# Patient Record
Sex: Female | Born: 1980 | Race: Black or African American | Hispanic: No | Marital: Single | Smoking: Never smoker
Health system: Southern US, Community
[De-identification: ages and names within clinical notes are randomized; demographics above are authoritative.]

## PROBLEM LIST (undated history)

## (undated) ENCOUNTER — Inpatient Hospital Stay (HOSPITAL_COMMUNITY): Payer: Self-pay

## (undated) HISTORY — PX: ECTOPIC PREGNANCY SURGERY: SHX613

---

## 2008-02-05 ENCOUNTER — Emergency Department (HOSPITAL_COMMUNITY): Admission: EM | Admit: 2008-02-05 | Discharge: 2008-02-05 | Payer: Self-pay | Admitting: Emergency Medicine

## 2008-04-03 ENCOUNTER — Encounter: Payer: Self-pay | Admitting: Emergency Medicine

## 2008-04-03 ENCOUNTER — Inpatient Hospital Stay (HOSPITAL_COMMUNITY): Admission: AD | Admit: 2008-04-03 | Discharge: 2008-04-03 | Payer: Self-pay | Admitting: Obstetrics and Gynecology

## 2008-04-06 ENCOUNTER — Inpatient Hospital Stay (HOSPITAL_COMMUNITY): Admission: AD | Admit: 2008-04-06 | Discharge: 2008-04-06 | Payer: Self-pay | Admitting: Obstetrics and Gynecology

## 2008-04-09 ENCOUNTER — Inpatient Hospital Stay (HOSPITAL_COMMUNITY): Admission: AD | Admit: 2008-04-09 | Discharge: 2008-04-09 | Payer: Self-pay | Admitting: Obstetrics and Gynecology

## 2008-04-12 ENCOUNTER — Inpatient Hospital Stay (HOSPITAL_COMMUNITY): Admission: AD | Admit: 2008-04-12 | Discharge: 2008-04-12 | Payer: Self-pay | Admitting: Obstetrics and Gynecology

## 2008-04-19 ENCOUNTER — Inpatient Hospital Stay (HOSPITAL_COMMUNITY): Admission: AD | Admit: 2008-04-19 | Discharge: 2008-04-19 | Payer: Self-pay | Admitting: Obstetrics and Gynecology

## 2008-04-28 ENCOUNTER — Inpatient Hospital Stay (HOSPITAL_COMMUNITY): Admission: AD | Admit: 2008-04-28 | Discharge: 2008-04-28 | Payer: Self-pay | Admitting: Obstetrics and Gynecology

## 2008-05-28 ENCOUNTER — Emergency Department (HOSPITAL_COMMUNITY): Admission: EM | Admit: 2008-05-28 | Discharge: 2008-05-28 | Payer: Self-pay | Admitting: Family Medicine

## 2008-06-28 ENCOUNTER — Emergency Department (HOSPITAL_COMMUNITY): Admission: EM | Admit: 2008-06-28 | Discharge: 2008-06-28 | Payer: Self-pay | Admitting: Emergency Medicine

## 2009-12-06 IMAGING — US US OB COMP LESS 14 WK
1 series · 14 of 28 positions shown · non-contrast
Comparison: None

CLINICAL DATA: *Abdominal pain,ABD PAIN; ; QUANTITATIVE BETA HCG
101.

OBSTETRIC <14 WK US AND TRANSVAGINAL OB US
TECHNIQUE: Both transabdominal and transvaginal ultrasound
examinations were performed for complete evaluation of the
gestation as well as the maternal uterus, adnexal regions, and
pelvic cul-de-sac.

[Series 1: unknown · 0.32mm/px · 14 of 49 slices shown]
[im 2/49]
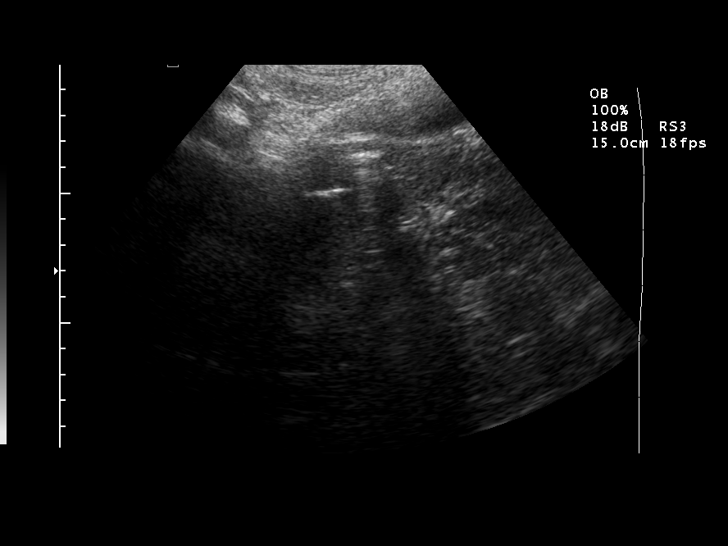
[im 6/49]
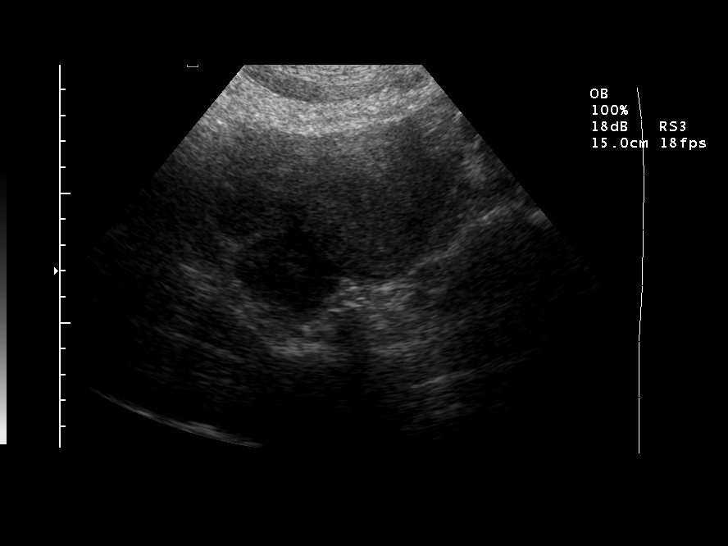
[im 9/49]
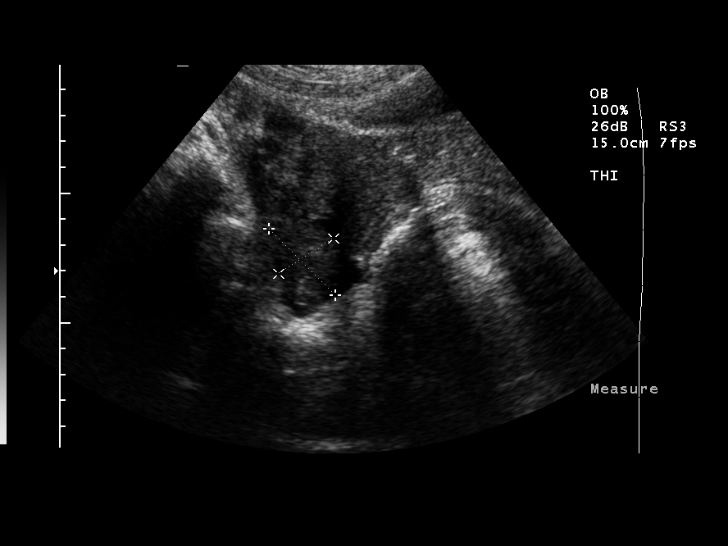
[im 13/49]
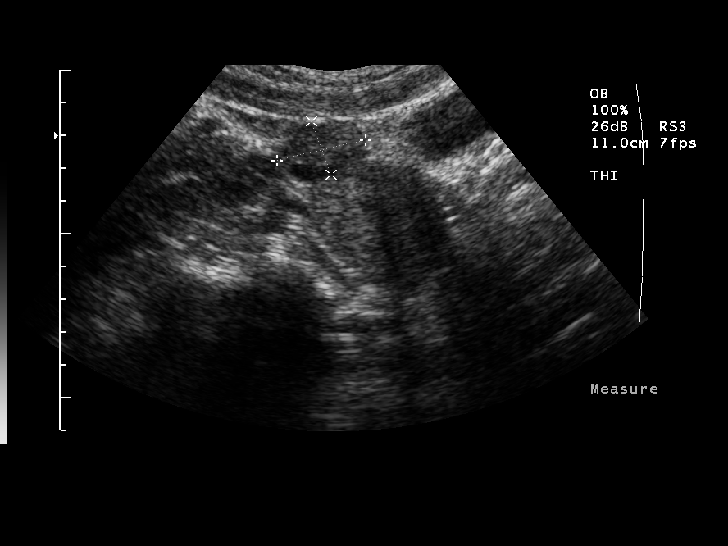
[im 17/49]
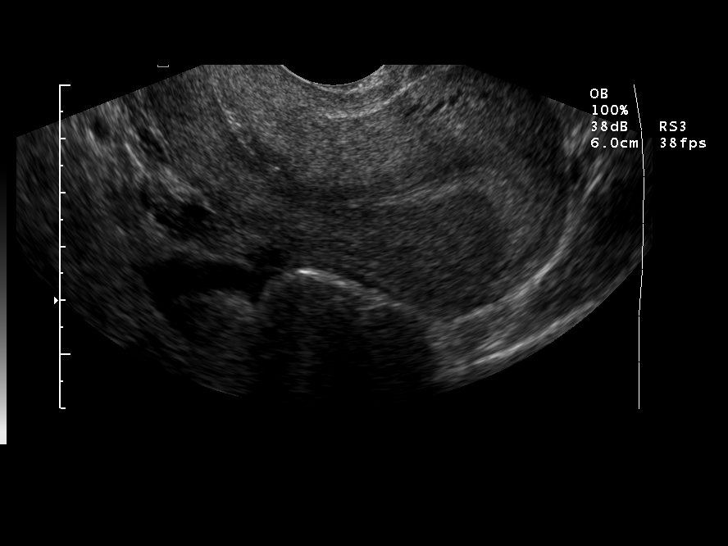
[im 20/49]
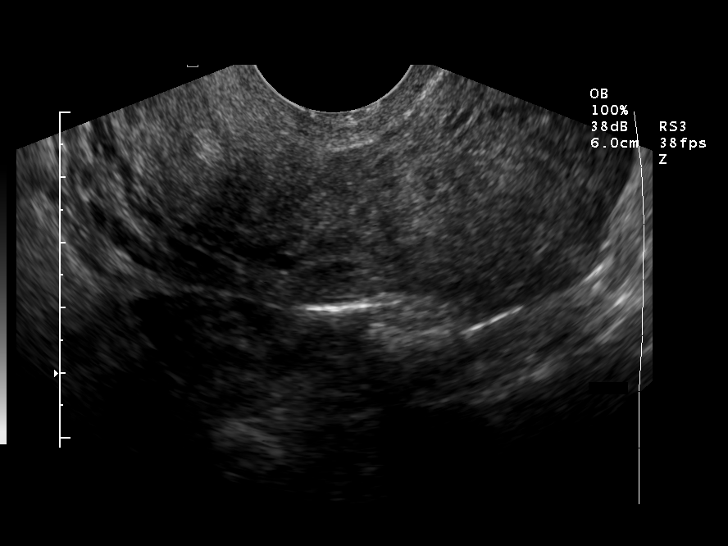
[im 24/49]
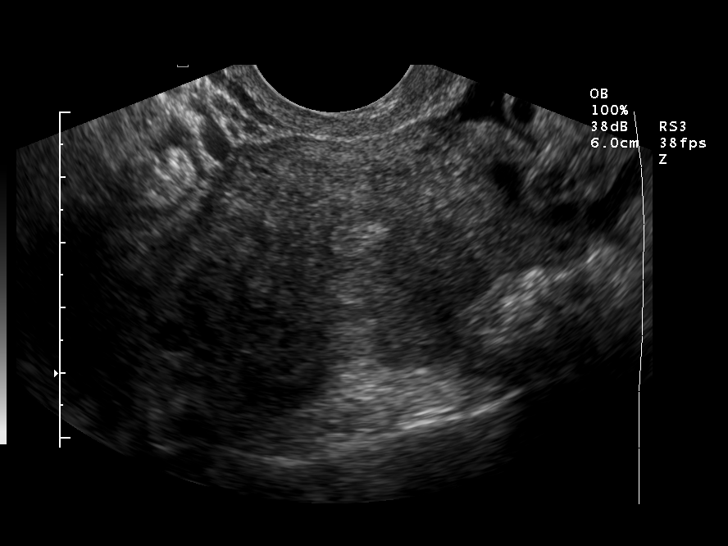
[im 27/49]
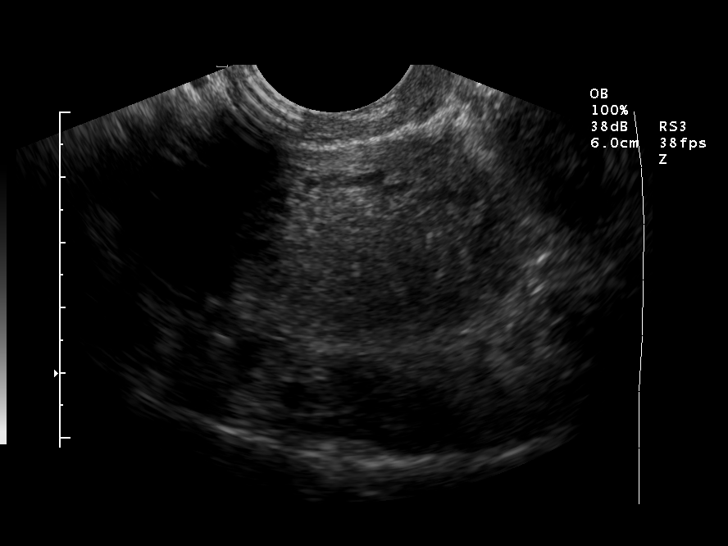
[im 31/49]
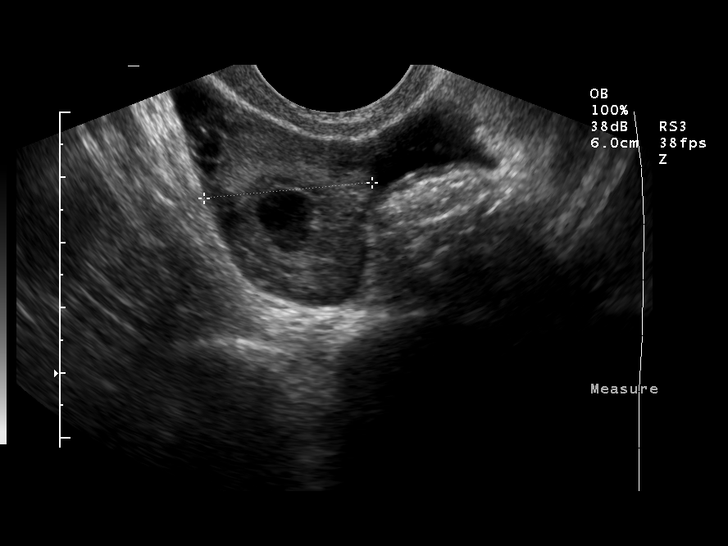
[im 34/49]
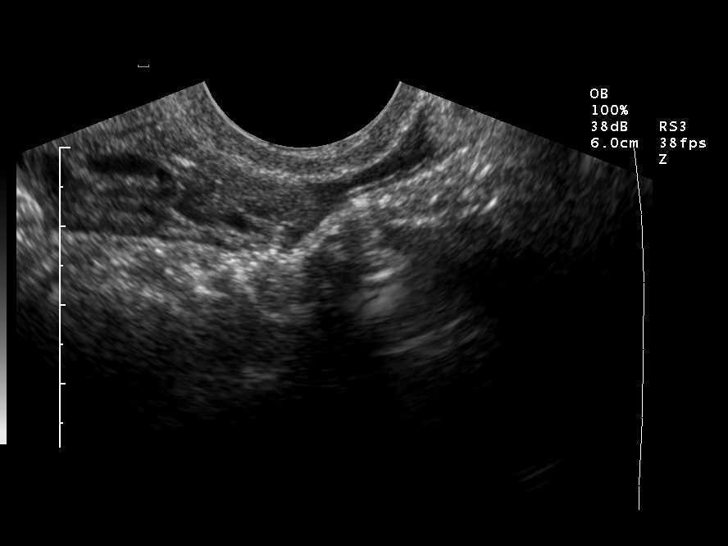
[im 38/49]
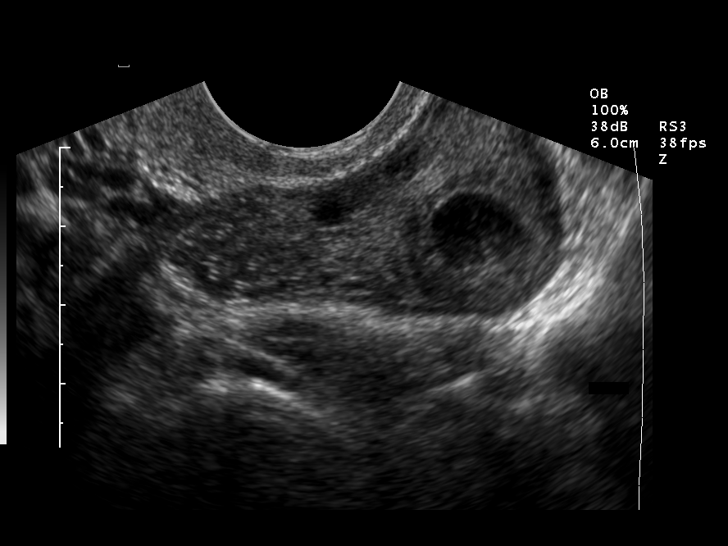
[im 41/49]
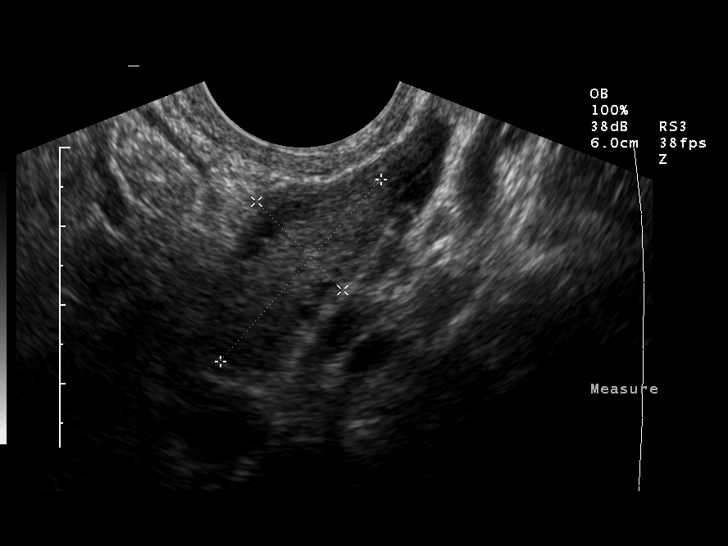
[im 45/49]
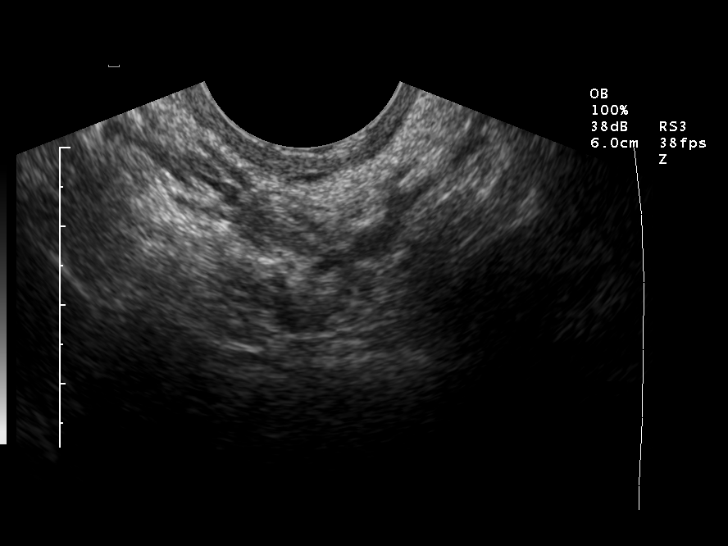
[im 49/49]
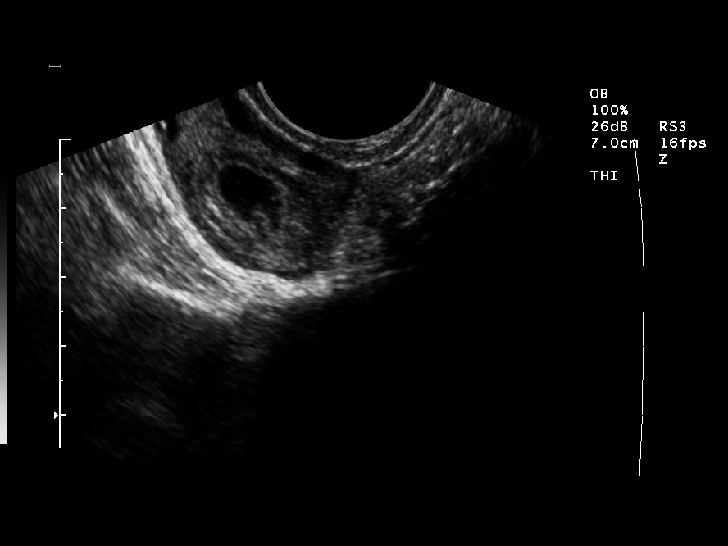

[14 of 28 positions shown; findings below may reference images not displayed]

FINDINGS: There is no intrauterine gestational sac identified.
Uterus is unremarkable.  Endometrium 8 mm in thickness.

Within the right adnexa, there is a 10 x 9 x 9 mm cystic structure
present.  Debris noted within the cystic structure.  There is
moderate free fluid in the pelvis.  Left ovary unremarkable.
IMPRESSION: No intrauterine gestational sac.

1 cm cystic structure in the right adnexa with surrounding blood
flow and moderate free fluid.  This could represent a right ectopic
pregnancy.

Critical test results telephoned to Dr. Gadidos at the time of
interpretation on 04/03/2008 at 3883.

## 2010-01-12 ENCOUNTER — Emergency Department (HOSPITAL_COMMUNITY): Admission: EM | Admit: 2010-01-12 | Discharge: 2010-01-12 | Payer: Self-pay | Admitting: Emergency Medicine

## 2010-08-14 LAB — GC/CHLAMYDIA PROBE AMP, GENITAL: Chlamydia, DNA Probe: NEGATIVE

## 2010-08-14 LAB — POCT URINALYSIS DIP (DEVICE)
Hgb urine dipstick: NEGATIVE
Nitrite: NEGATIVE
Urobilinogen, UA: 0.2 mg/dL (ref 0.0–1.0)
pH: 6 (ref 5.0–8.0)

## 2010-08-14 LAB — WET PREP, GENITAL
Trich, Wet Prep: NONE SEEN
Yeast Wet Prep HPF POC: NONE SEEN

## 2010-08-14 LAB — POCT PREGNANCY, URINE: Preg Test, Ur: NEGATIVE

## 2011-02-02 LAB — URINALYSIS, ROUTINE W REFLEX MICROSCOPIC
Bilirubin Urine: NEGATIVE
Glucose, UA: NEGATIVE mg/dL
Ketones, ur: 15 mg/dL — AB
Protein, ur: 30 mg/dL — AB

## 2011-02-02 LAB — POCT I-STAT, CHEM 8
BUN: 6 mg/dL (ref 6–23)
Calcium, Ion: 1.24 mmol/L (ref 1.12–1.32)
Chloride: 100 mEq/L (ref 96–112)

## 2011-02-02 LAB — DIFFERENTIAL
Basophils Absolute: 0 10*3/uL (ref 0.0–0.1)
Basophils Relative: 0 % (ref 0–1)
Monocytes Absolute: 0.5 10*3/uL (ref 0.1–1.0)
Neutro Abs: 6.3 10*3/uL (ref 1.7–7.7)
Neutrophils Relative %: 63 % (ref 43–77)

## 2011-02-02 LAB — HCG, QUANTITATIVE, PREGNANCY
hCG, Beta Chain, Quant, S: 10 m[IU]/mL — ABNORMAL HIGH (ref <5)
hCG, Beta Chain, Quant, S: 214 m[IU]/mL — ABNORMAL HIGH (ref <5)
hCG, Beta Chain, Quant, S: 627 m[IU]/mL — ABNORMAL HIGH (ref <5)
hCG, Beta Chain, Quant, S: 634 m[IU]/mL — ABNORMAL HIGH (ref <5)
hCG, Beta Chain, Quant, S: <2 m[IU]/mL (ref <5)

## 2011-02-02 LAB — URINE CULTURE: Colony Count: >=100000

## 2011-02-02 LAB — URINE MICROSCOPIC-ADD ON

## 2011-02-02 LAB — CBC
Hemoglobin: 11.2 g/dL — ABNORMAL LOW (ref 12.0–15.0)
Hemoglobin: 12.1 g/dL (ref 12.0–15.0)
MCHC: 33.7 g/dL (ref 30.0–36.0)
Platelets: 183 10*3/uL (ref 150–400)
RDW: 12.9 % (ref 11.5–15.5)
RDW: 13.3 % (ref 11.5–15.5)

## 2011-02-02 LAB — ABO/RH
ABO/RH(D): O POS
ABO/RH(D): O POS

## 2011-02-02 LAB — WET PREP, GENITAL: Trich, Wet Prep: NONE SEEN

## 2011-02-02 LAB — GC/CHLAMYDIA PROBE AMP, GENITAL: Chlamydia, DNA Probe: NEGATIVE

## 2011-02-02 LAB — AST: AST: 13 U/L (ref 0–37)

## 2011-10-19 ENCOUNTER — Encounter (HOSPITAL_COMMUNITY): Payer: Self-pay | Admitting: *Deleted

## 2011-10-19 ENCOUNTER — Emergency Department (HOSPITAL_COMMUNITY)
Admission: EM | Admit: 2011-10-19 | Discharge: 2011-10-19 | Disposition: A | Discharge status: Home / Self Care | Payer: Managed Care, Other (non HMO) | Attending: Emergency Medicine | Admitting: Emergency Medicine

## 2011-10-19 DIAGNOSIS — R42 Dizziness and giddiness: Secondary | ICD-10-CM | POA: Insufficient documentation

## 2011-10-19 DIAGNOSIS — R110 Nausea: Secondary | ICD-10-CM | POA: Insufficient documentation

## 2011-10-19 DIAGNOSIS — R51 Headache: Secondary | ICD-10-CM

## 2011-10-19 LAB — POCT I-STAT, CHEM 8
Calcium, Ion: 1.29 mmol/L (ref 1.12–1.32)
Glucose, Bld: 79 mg/dL (ref 70–99)
HCT: 38 % (ref 36.0–46.0)
Hemoglobin: 12.9 g/dL (ref 12.0–15.0)

## 2011-10-19 MED ORDER — SODIUM CHLORIDE 0.9 % IV BOLUS (SEPSIS)
1000.0000 mL | Freq: Once | INTRAVENOUS | Status: AC
  Administered 2011-10-19: 1000 mL via INTRAVENOUS

## 2011-10-19 MED ORDER — METOCLOPRAMIDE HCL 5 MG/ML IJ SOLN
10.0000 mg | Freq: Once | INTRAMUSCULAR | Status: AC
  Administered 2011-10-19: 10 mg via INTRAVENOUS
  Filled 2011-10-19: qty 2

## 2011-10-19 MED ORDER — NAPROXEN 375 MG PO TABS: 375.0000 mg | ORAL_TABLET | Freq: Two times a day (BID) | ORAL | Status: AC

## 2011-10-19 MED ORDER — KETOROLAC TROMETHAMINE 30 MG/ML IJ SOLN
30.0000 mg | Freq: Once | INTRAMUSCULAR | Status: AC
  Administered 2011-10-19: 30 mg via INTRAVENOUS
  Filled 2011-10-19: qty 1

## 2011-10-19 MED ORDER — MORPHINE SULFATE 4 MG/ML IJ SOLN
4.0000 mg | Freq: Once | INTRAMUSCULAR | Status: AC
  Administered 2011-10-19: 4 mg via INTRAVENOUS
  Filled 2011-10-19: qty 1

## 2011-10-19 NOTE — ED Notes (Signed)
Pt states she's had a headache and slight dizziness since last pm

## 2011-10-19 NOTE — ED Provider Notes (Signed)
History     CSN: [id number]  Arrival date & time 10/19/11  1427   First MD Initiated Contact with Patient 10/19/11 1559      Chief Complaint  Patient presents with  . Nausea  . Headache  . Dizziness     The history is provided by the patient.   the patient reports developing a headache last night.  She has photophobia and phonophobia.  She has a history of headaches when she was younger but has not had a headache this severe and very long time.  She reports generalized weakness and nausea.  She has had some dizziness.  Denies vomiting.  She is or diarrhea.  She denies chest pain shortness of breath.  She denies weakness of her upper and lower extremities . She has no changes in her vision.  Her symptoms are mild to moderate in severity.  Nothing improves her symptoms.     History reviewed. No pertinent past medical history.  Past Surgical History  Procedure Date  . Ectopic pregnancy surgery     History reviewed. No pertinent family history.  History  Substance Use Topics  . Smoking status: Not on file  . Smokeless tobacco: Not on file  . Alcohol Use: No    OB History    Grav Para Term Preterm Abortions TAB SAB Ect Mult Living                  Review of Systems  Neurological: Positive for headaches.  All other systems reviewed and are negative.    Allergies  Review of patient's allergies indicates no known allergies.  Home Medications   Current Outpatient Rx  Name Route Sig Dispense Refill  . CALCIUM CARBONATE 600 MG PO TABS Oral Take 600 mg by mouth daily.    . WOMENS DAILY FORMULA PO Oral Take 1 tablet by mouth daily.      BP 107/64  Pulse 60  Temp 98.4 F (36.9 C) (Oral)  Resp 16  Ht 5\' 2"  (1.575 m)  Wt 130 lb (58.968 kg)  BMI 23.78 kg/m2  SpO2 100%  Physical Exam  Nursing note and vitals reviewed. Constitutional: She is oriented to person, place, and time. She appears well-developed and well-nourished. No distress.  HENT:  Head:  Normocephalic and atraumatic.  Eyes: EOM are normal. Pupils are equal, round, and reactive to light.  Neck: Normal range of motion.  Cardiovascular: Normal rate, regular rhythm and normal heart sounds.   Pulmonary/Chest: Effort normal and breath sounds normal.  Abdominal: Soft. She exhibits no distension. There is no tenderness.  Musculoskeletal: Normal range of motion.  Neurological: She is alert and oriented to person, place, and time.       5/5 strength in major muscle groups of  bilateral upper and lower extremities. Speech normal. No facial asymetry.   Skin: Skin is warm and dry.  Psychiatric: She has a normal mood and affect. Judgment normal.    ED Course  Procedures (including critical care time)   Labs Reviewed  PREGNANCY, URINE  POCT I-STAT, CHEM 8   No results found.   No diagnosis found.    MDM  Typical headache for the pt. Non focal neuro exam. No recent head trauma. No fever. Doubt meningitis. Doubt intracranial bleed. Doubt normal pressure hydrocephalus. No indication for imaging. Will treat with migraine cocktail and reevaluate  5:06 PM The pt feels better. HA improved. Dc home in good condition       Vania Rea Mylo,  MD 10/19/11 1706

## 2011-10-19 NOTE — Discharge Instructions (Signed)

## 2011-10-19 NOTE — ED Notes (Signed)
Pt c/o HA, dizziness, and nausea since last night. States she has headaches but dizziness is new. Denies vomiting.

## 2012-08-25 ENCOUNTER — Emergency Department (INDEPENDENT_AMBULATORY_CARE_PROVIDER_SITE_OTHER)
Admission: EM | Admit: 2012-08-25 | Discharge: 2012-08-25 | Disposition: A | Discharge status: Home / Self Care | Payer: Self-pay | Source: Home / Self Care | Attending: Family Medicine | Admitting: Family Medicine

## 2012-08-25 ENCOUNTER — Encounter (HOSPITAL_COMMUNITY): Payer: Self-pay | Admitting: Emergency Medicine

## 2012-08-25 DIAGNOSIS — J302 Other seasonal allergic rhinitis: Secondary | ICD-10-CM

## 2012-08-25 DIAGNOSIS — J309 Allergic rhinitis, unspecified: Secondary | ICD-10-CM

## 2012-08-25 MED ORDER — TRIAMCINOLONE ACETONIDE 40 MG/ML IJ SUSP
40.0000 mg | Freq: Once | INTRAMUSCULAR | Status: AC
  Administered 2012-08-25: 40 mg via INTRAMUSCULAR

## 2012-08-25 MED ORDER — METHYLPREDNISOLONE ACETATE 80 MG/ML IJ SUSP
INTRAMUSCULAR | Status: AC
  Filled 2012-08-25: qty 1

## 2012-08-25 MED ORDER — METHYLPREDNISOLONE ACETATE 40 MG/ML IJ SUSP
80.0000 mg | Freq: Once | INTRAMUSCULAR | Status: AC
  Administered 2012-08-25: 80 mg via INTRAMUSCULAR

## 2012-08-25 MED ORDER — BUDESONIDE 32 MCG/ACT NA SUSP: 1.0000 | Freq: Two times a day (BID) | NASAL | Status: DC

## 2012-08-25 MED ORDER — TRIAMCINOLONE ACETONIDE 40 MG/ML IJ SUSP
INTRAMUSCULAR | Status: AC
  Filled 2012-08-25: qty 5

## 2012-08-25 MED ORDER — FEXOFENADINE HCL 180 MG PO TABS: 180.0000 mg | ORAL_TABLET | Freq: Every day | ORAL | Status: DC

## 2012-08-25 NOTE — ED Notes (Signed)
Sore throat , both ears hurting, generally right ear more painful than left ear, headache- onset last Thursday.  Has tried mucinex and aleve products with no relief.

## 2012-08-25 NOTE — ED Notes (Signed)
Notified of post injection delay, patient not ready for discharge

## 2012-08-25 NOTE — ED Notes (Signed)
Patient not ready for discharge, medications to be administered.

## 2012-08-25 NOTE — ED Provider Notes (Signed)
History     CSN: [id number]  Arrival date & time 08/25/12  1225   First MD Initiated Contact with Patient 08/25/12 1434      Chief Complaint  Patient presents with  . Sore Throat    (Consider location/radiation/quality/duration/timing/severity/associated sxs/prior treatment) Patient is a 32 y.o. female presenting with pharyngitis. The history is provided by the patient.  Sore Throat This is a new problem. The current episode started more than 2 days ago. The problem has been gradually worsening. The symptoms are aggravated by sneezing.    History reviewed. No pertinent past medical history.  Past Surgical History  Procedure Laterality Date  . Ectopic pregnancy surgery      No family history on file.  History  Substance Use Topics  . Smoking status: Not on file  . Smokeless tobacco: Not on file  . Alcohol Use: No    OB History   Grav Para Term Preterm Abortions TAB SAB Ect Mult Living                  Review of Systems  Constitutional: Negative.   HENT: Positive for congestion, sore throat, rhinorrhea, sneezing and postnasal drip.     Allergies  Review of patient's allergies indicates no known allergies.  Home Medications   Current Outpatient Rx  Name  Route  Sig  Dispense  Refill  . dextromethorphan-guaiFENesin (MUCINEX DM) 30-600 MG per 12 hr tablet   Oral   Take 1 tablet by mouth every 12 (twelve) hours.         . Naproxen Sodium (ALEVE PO)   Oral   Take by mouth.         . budesonide (RHINOCORT AQUA) 32 MCG/ACT nasal spray   Nasal   Place 1 spray into the nose 2 (two) times daily. One spray each nostril bid   1 Bottle   1   . calcium carbonate (OS-CAL) 600 MG TABS   Oral   Take 600 mg by mouth daily.         . fexofenadine (ALLEGRA) 180 MG tablet   Oral   Take 1 tablet (180 mg total) by mouth daily.   30 tablet   1   . Multiple Vitamins-Minerals (WOMENS DAILY FORMULA PO)   Oral   Take 1 tablet by mouth daily.         .  naproxen (NAPROSYN) 375 MG tablet   Oral   Take 1 tablet (375 mg total) by mouth 2 (two) times daily.   6 tablet   0     BP 115/69  Pulse 64  Temp(Src) 98.4 F (36.9 C) (Oral)  Resp 12  SpO2 100%  Physical Exam  Nursing note and vitals reviewed. Constitutional: She is oriented to person, place, and time. She appears well-developed and well-nourished.  HENT:  Head: Normocephalic.  Right Ear: External ear normal.  Left Ear: External ear normal.  Nose: Mucosal edema and rhinorrhea present.  Mouth/Throat: Oropharynx is clear and moist.  Neck: Normal range of motion. Neck supple.  Cardiovascular: Regular rhythm.   Pulmonary/Chest: Effort normal and breath sounds normal.  Lymphadenopathy:    She has no cervical adenopathy.  Neurological: She is alert and oriented to person, place, and time.  Skin: Skin is warm.    ED Course  Procedures (including critical care time)  Labs Reviewed - No data to display No results found.   1. Seasonal allergic reaction       MDM  Linna Hoff, MD 08/26/12 2013

## 2014-04-09 ENCOUNTER — Encounter (HOSPITAL_COMMUNITY): Payer: Self-pay | Admitting: Emergency Medicine

## 2014-04-09 ENCOUNTER — Other Ambulatory Visit (HOSPITAL_COMMUNITY)
Admission: RE | Admit: 2014-04-09 | Discharge: 2014-04-09 | Disposition: A | Discharge status: Home / Self Care | Payer: BC Managed Care – PPO | Source: Ambulatory Visit | Attending: Emergency Medicine | Admitting: Emergency Medicine

## 2014-04-09 ENCOUNTER — Emergency Department (HOSPITAL_COMMUNITY)
Admission: EM | Admit: 2014-04-09 | Discharge: 2014-04-09 | Disposition: A | Discharge status: Home / Self Care | Payer: BC Managed Care – PPO | Source: Home / Self Care

## 2014-04-09 DIAGNOSIS — N760 Acute vaginitis: Secondary | ICD-10-CM | POA: Diagnosis present

## 2014-04-09 DIAGNOSIS — A64 Unspecified sexually transmitted disease: Secondary | ICD-10-CM

## 2014-04-09 DIAGNOSIS — R102 Pelvic and perineal pain: Secondary | ICD-10-CM

## 2014-04-09 LAB — POCT URINALYSIS DIP (DEVICE)
BILIRUBIN URINE: NEGATIVE
Glucose, UA: NEGATIVE mg/dL
Hgb urine dipstick: NEGATIVE
KETONES UR: NEGATIVE mg/dL
LEUKOCYTES UA: NEGATIVE
Nitrite: NEGATIVE
Protein, ur: NEGATIVE mg/dL
Specific Gravity, Urine: 1.015 (ref 1.005–1.030)
Urobilinogen, UA: 1 mg/dL (ref 0.0–1.0)
pH: 7 (ref 5.0–8.0)

## 2014-04-09 LAB — POCT PREGNANCY, URINE: PREG TEST UR: NEGATIVE

## 2014-04-09 MED ORDER — CEFTRIAXONE SODIUM 250 MG IJ SOLR
INTRAMUSCULAR | Status: AC
  Filled 2014-04-09: qty 250

## 2014-04-09 MED ORDER — AZITHROMYCIN 250 MG PO TABS
1000.0000 mg | ORAL_TABLET | Freq: Once | ORAL | Status: AC
  Administered 2014-04-09: 1000 mg via ORAL

## 2014-04-09 MED ORDER — LIDOCAINE HCL (PF) 2 % IJ SOLN
INTRAMUSCULAR | Status: AC
  Filled 2014-04-09: qty 2

## 2014-04-09 MED ORDER — CEFTRIAXONE SODIUM 1 G IJ SOLR
250.0000 mg | Freq: Once | INTRAMUSCULAR | Status: AC
  Administered 2014-04-09: 250 mg via INTRAMUSCULAR

## 2014-04-09 MED ORDER — AZITHROMYCIN 250 MG PO TABS
ORAL_TABLET | ORAL | Status: AC
  Filled 2014-04-09: qty 4

## 2014-04-09 NOTE — ED Provider Notes (Signed)
CSN: [id number]     Arrival date & time 04/09/14  1904 History   None    Chief Complaint  Patient presents with  . Abdominal Pain  . Back Pain    Patient is a 33 y.o. female presenting with abdominal pain and back pain. The history is provided by the patient.  Abdominal Pain Pain location:  RLQ and LLQ Pain quality: aching and cramping   Pain radiates to:  Back Pain severity:  Moderate Onset quality:  Gradual Duration:  2 weeks Timing:  Constant Progression:  Worsening Chronicity:  New Context: recent sexual activity   Context: not eating, not laxative use, not recent illness, not retching and not suspicious food intake   Worsened by:  Nothing tried Ineffective treatments:  None tried Associated symptoms: no anorexia, no constipation, no diarrhea, no dysuria, no fever, no nausea, no shortness of breath, no vaginal bleeding and no vaginal discharge   Back Pain Associated symptoms: abdominal pain   Associated symptoms: no dysuria and no fever   Pt reports a 2 week h/o lower abd pain R>L. Approx 1 week ago she noticed a foul vaginal odor (but no obvious increased or abnormal vaginal discharge) which seemed to resolve. Has also noted some urinary frequency w/o dysuria. Denies vag itching. Admits to tendency to get bacterial vaginosis and yeast infections. Admits h/o GC and  to recent unprotected intercourse. LMP 09/2013 (Was on Depo then went off and started OC's). She recently had to stop the OC's d/t nausea. Has not started her period yet. Has been worked up by her GYN MD regarding her amenorrhea w/o positive findings. Pt denies fever, N/V/D or constipation. Pain at times worse with sitting.  History reviewed. No pertinent past medical history. Past Surgical History  Procedure Laterality Date  . Ectopic pregnancy surgery     No family history on file. History  Substance Use Topics  . Smoking status: Never Smoker   . Smokeless tobacco: Not on file  . Alcohol Use: Yes   OB History     No data available     Review of Systems  Constitutional: Negative for fever.  Respiratory: Negative for shortness of breath.   Gastrointestinal: Positive for abdominal pain. Negative for nausea, diarrhea, constipation and anorexia.  Genitourinary: Negative for dysuria, vaginal bleeding and vaginal discharge.  Musculoskeletal: Positive for back pain.  All other systems reviewed and are negative.   Allergies  Review of patient's allergies indicates no known allergies.  Home Medications   Prior to Admission medications   Medication Sig Start Date End Date Taking? Authorizing Provider  budesonide (RHINOCORT AQUA) 32 MCG/ACT nasal spray Place 1 spray into the nose 2 (two) times daily. One spray each nostril bid 08/25/12   Billy Fischer, MD  calcium carbonate (OS-CAL) 600 MG TABS Take 600 mg by mouth daily.    Historical Provider, MD  dextromethorphan-guaiFENesin (MUCINEX DM) 30-600 MG per 12 hr tablet Take 1 tablet by mouth every 12 (twelve) hours.    Historical Provider, MD  fexofenadine (ALLEGRA) 180 MG tablet Take 1 tablet (180 mg total) by mouth daily. 08/25/12   Billy Fischer, MD  Multiple Vitamins-Minerals (WOMENS DAILY FORMULA PO) Take 1 tablet by mouth daily.    Historical Provider, MD  Naproxen Sodium (ALEVE PO) Take by mouth.    Historical Provider, MD   BP 108/69 mmHg  Pulse 66  Temp(Src) 98.3 F (36.8 C) (Oral)  Resp 16  SpO2 99%  LMP 10/10/2013 Physical Exam  Constitutional: She appears well-developed and well-nourished.  HENT:  Head: Normocephalic and atraumatic.  Eyes: Conjunctivae are normal.  Cardiovascular: Normal rate.   Abdominal: Soft. Bowel sounds are normal. She exhibits no distension and no mass. There is no hepatosplenomegaly, splenomegaly or hepatomegaly. There is tenderness in the right lower quadrant and left lower quadrant. There is no rigidity, no rebound, no guarding, no CVA tenderness, no tenderness at McBurney's point and negative Murphy's sign.  Hernia confirmed negative in the right inguinal area and confirmed negative in the left inguinal area.  Genitourinary: Uterus normal. No labial fusion. There is no rash, tenderness, lesion or injury on the right labia. There is no rash, tenderness, lesion or injury on the left labia. Cervix exhibits motion tenderness, discharge and friability. Right adnexum displays no mass, no tenderness and no fullness. Left adnexum displays no mass, no tenderness and no fullness. No erythema, tenderness or bleeding in the vagina. No foreign body around the vagina. No signs of injury around the vagina. Vaginal discharge found.  Creamy whitish/green discharge  Lymphadenopathy:       Right: No inguinal adenopathy present.       Left: No inguinal adenopathy present.  Nursing note and vitals reviewed.   ED Course  Pelvic exam Date/Time: 04/09/2014 8:48 PM Performed by: Jeryl Columbia Authorized by: Jeryl Columbia Consent: Verbal consent obtained. Risks and benefits: risks, benefits and alternatives were discussed Consent given by: patient Patient understanding: patient states understanding of the procedure being performed Patient consent: the patient's understanding of the procedure matches consent given Patient identity confirmed: verbally with patient and arm band Time out: Immediately prior to procedure a "time out" was called to verify the correct patient, procedure, equipment, support staff and site/side marked as required. Preparation: Patient was prepped and draped in the usual sterile fashion. Local anesthesia used: no Patient sedated: no Patient tolerance: Patient tolerated the procedure well with no immediate complications   (including critical care time) Labs Review Labs Reviewed  POCT URINALYSIS DIP (DEVICE)  POCT PREGNANCY, URINE  CERVICOVAGINAL ANCILLARY ONLY    Imaging Review No results found.   MDM   1. STI (sexually transmitted infection)   2. Pelvic pain in female     Findings suspicious for STI vs Trich though BV also possible. No fever. Given Rocephin 250 mg Im in office w/ Zithromax 1000 mg po. Discussed further screening w/ PCP. U/A neg. F/u cultures. Tx as indicated. Pt encouraged to f/u PCP.    Jeryl Columbia, NP 04/09/14 2051

## 2014-04-09 NOTE — Discharge Instructions (Signed)
Your exam today reveals you do have a vaginal infection. It is possible you have a sexually transmitted disease, Bacterial vaginosis, Trichomonas and or a yeast infection. It is also possible that you may be attempting to start your period. The cultures we collected today will result in 2 days. If anything is found positive you will be notified by phone. If necessary we can call in medications if further treatment is indicated. Please follow up as needed with Dr Ayesha Rumpf as indicated for further screening (such as HIV and or Syphilis) or discussion regarding new contraceptive choices.   Pelvic Pain Pelvic pain is pain felt below the belly button and between your hips. It can be caused by many different things. It is important to get help right away. This is especially true for severe, sharp, or unusual pain that comes on suddenly.  HOME CARE  Only take medicine as told by your doctor.  Rest as told by your doctor.  Eat a healthy diet, such as fruits, vegetables, and lean meats.  Drink enough fluids to keep your pee (urine) clear or pale yellow, or as told.  Avoid sex (intercourse) if it causes pain.  Apply warm or cold packs to your lower belly (abdomen). Use the type of pack that helps the pain.  Avoid situations that cause you stress.  Keep a journal to track your pain. Write down:  When the pain started.  Where it is located.  If there are things that seem to be related to the pain, such as food or your period.  Follow up with your doctor as told. GET HELP RIGHT AWAY IF:   You have heavy bleeding from the vagina.  You have more pelvic pain.  You feel lightheaded or pass out (faint).  You have chills.  You have pain when you pee or have blood in your pee.  You cannot stop having watery poop (diarrhea).  You cannot stop throwing up (vomiting).  You have a fever or lasting symptoms for more than 3 days.  You have a fever and your symptoms suddenly get worse.  You are  being physically or sexually abused.  Your medicine does not help your pain.  You have fluid (discharge) coming from your vagina that is not normal. MAKE SURE YOU:  Understand these instructions.  Will watch your condition.  Will get help if you are not doing well or get worse. Document Released: 10/03/2007 Document Revised: 10/16/2011 Document Reviewed: 08/06/2011 [facility] Patient Information 2015 [place], [place]. This information is not intended to replace advice given to you by your health care provider. Make sure you discuss any questions you have with your health care provider.   Sexually Transmitted Disease A sexually transmitted disease (STD) is a disease or infection often passed to another person during sex. However, STDs can be passed through nonsexual ways. An STD can be passed through:  Spit (saliva).  Semen.  Blood.  Mucus from the vagina.  Pee (urine). HOW CAN I LESSEN MY CHANCES OF GETTING AN STD?  Use:  Latex condoms.  Water-soluble lubricants with condoms. Do not use petroleum jelly or oils.  Dental dams. These are small pieces of latex that are used as a barrier during oral sex.  Avoid having more than one sex partner.  Do not have sex with someone who has other sex partners.  Do not have sex with anyone you do not know or who is at high risk for an STD.  Avoid risky sex that can break  your skin.  Do not have sex if you have open sores on your mouth or skin.  Avoid drinking too much alcohol or taking illegal drugs. Alcohol and drugs can affect your good judgment.  Avoid oral and anal sex acts.  Get shots (vaccines) for HPV and hepatitis.  If you are at risk of being infected with HIV, it is advised that you take a certain medicine daily to prevent HIV infection. This is called pre-exposure prophylaxis (PrEP). You may be at risk if:  You are a man who has sex with other men (MSM).  You are attracted to the opposite sex (heterosexual) and are  having sex with more than one partner.  You take drugs with a needle.  You have sex with someone who has HIV.  Talk with your doctor about if you are at high risk of being infected with HIV. If you begin to take PrEP, get tested for HIV first. Get tested every 3 months for as long as you are taking PrEP. WHAT SHOULD I DO IF I THINK I HAVE AN STD?  See your doctor.  Tell your sex partner(s) that you have an STD. They should be tested and treated.  Do not have sex until your doctor says it is okay. WHEN SHOULD I GET HELP? Get help right away if:  You have bad belly (abdominal) pain.  You are a man and have puffiness (swelling) or pain in your testicles.  You are a woman and have puffiness in your vagina. Document Released: 05/24/2004 Document Revised: 04/21/2013 Document Reviewed: 10/10/2012 [facility] Patient Information 2015 [place], [place]. This information is not intended to replace advice given to you by your health care provider. Make sure you discuss any questions you have with your health care provider.

## 2014-04-09 NOTE — ED Notes (Signed)
C/o lower back pain and and RLQ pain onset 1 week Sx also include: urinary frequency Pain increases when sitting for long periods Alert, no signs of acute distress.

## 2014-04-12 ENCOUNTER — Telehealth (HOSPITAL_COMMUNITY): Payer: Self-pay

## 2014-04-12 LAB — CERVICOVAGINAL ANCILLARY ONLY
Chlamydia: NEGATIVE
Neisseria Gonorrhea: NEGATIVE
Trichomonas: NEGATIVE

## 2014-04-12 NOTE — ED Notes (Signed)
Mary from cytology called stating that the patient's specimens had been collected incorrectly.  She stated they would still be able to run the test but it would be at an increased cost.  Chart was reviewed with Dr Jake Michaelis.  He stated to go ahead with the test given the patient's history.  Stanton Kidney was made aware.

## 2014-04-13 ENCOUNTER — Encounter (HOSPITAL_COMMUNITY): Payer: Self-pay | Admitting: Family Medicine

## 2014-04-14 LAB — CERVICOVAGINAL ANCILLARY ONLY
Bacterial vaginitis: POSITIVE — AB
Candida vaginitis: NEGATIVE

## 2014-04-17 MED ORDER — METRONIDAZOLE 500 MG PO TABS: 500.0000 mg | ORAL_TABLET | Freq: Two times a day (BID) | ORAL | Status: DC

## 2014-04-17 NOTE — ED Notes (Signed)
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 She will need metronidazole 500 mg, #14, 1 twice a day for one week. This will be sent to her pharmacy. We will need to call her and let her know these results.   Harden Mo, MD 04/17/14 [phone]

## 2014-04-17 NOTE — Telephone Encounter (Signed)
-----   Message from Roselyn Meier, RN sent at 04/15/2014  3:48 PM EST ----- Regarding: lab Gardnerella pos.  Do you want to treat this?  This is that pt. we discussed of K. Schorr NP. She denied d/c but d/c found on exam. 04/15/2014

## 2014-04-19 ENCOUNTER — Telehealth (HOSPITAL_COMMUNITY): Payer: Self-pay | Admitting: *Deleted

## 2014-04-19 NOTE — ED Notes (Signed)
See previous notes.  I called pt. Pt. verified x 2 and given results.  Pt. Told she needs Flagyl for bacterial vaginosis and where to pick up her Rx.   Pt. instructed to no alcohol while taking this medication.  Pt. voiced understanding. Roselyn Meier 04/19/2014

## 2014-10-01 ENCOUNTER — Emergency Department (HOSPITAL_COMMUNITY)
Admission: EM | Admit: 2014-10-01 | Discharge: 2014-10-01 | Disposition: A | Discharge status: Home / Self Care | Payer: BLUE CROSS/BLUE SHIELD | Attending: Emergency Medicine | Admitting: Emergency Medicine

## 2014-10-01 ENCOUNTER — Encounter (HOSPITAL_COMMUNITY): Payer: Self-pay | Admitting: Emergency Medicine

## 2014-10-01 DIAGNOSIS — Y998 Other external cause status: Secondary | ICD-10-CM | POA: Diagnosis not present

## 2014-10-01 DIAGNOSIS — Z79899 Other long term (current) drug therapy: Secondary | ICD-10-CM | POA: Insufficient documentation

## 2014-10-01 DIAGNOSIS — Y9389 Activity, other specified: Secondary | ICD-10-CM | POA: Diagnosis not present

## 2014-10-01 DIAGNOSIS — S199XXA Unspecified injury of neck, initial encounter: Secondary | ICD-10-CM | POA: Diagnosis not present

## 2014-10-01 DIAGNOSIS — S0990XA Unspecified injury of head, initial encounter: Secondary | ICD-10-CM | POA: Insufficient documentation

## 2014-10-01 DIAGNOSIS — S3991XA Unspecified injury of abdomen, initial encounter: Secondary | ICD-10-CM | POA: Diagnosis not present

## 2014-10-01 DIAGNOSIS — S4991XA Unspecified injury of right shoulder and upper arm, initial encounter: Secondary | ICD-10-CM | POA: Insufficient documentation

## 2014-10-01 DIAGNOSIS — S3992XA Unspecified injury of lower back, initial encounter: Secondary | ICD-10-CM | POA: Insufficient documentation

## 2014-10-01 DIAGNOSIS — Y92410 Unspecified street and highway as the place of occurrence of the external cause: Secondary | ICD-10-CM | POA: Diagnosis not present

## 2014-10-01 DIAGNOSIS — S299XXA Unspecified injury of thorax, initial encounter: Secondary | ICD-10-CM | POA: Diagnosis not present

## 2014-10-01 DIAGNOSIS — S4992XA Unspecified injury of left shoulder and upper arm, initial encounter: Secondary | ICD-10-CM | POA: Diagnosis not present

## 2014-10-01 DIAGNOSIS — M542 Cervicalgia: Secondary | ICD-10-CM

## 2014-10-01 MED ORDER — IBUPROFEN 600 MG PO TABS: 600.0000 mg | ORAL_TABLET | Freq: Four times a day (QID) | ORAL | Status: DC | PRN

## 2014-10-01 MED ORDER — HYDROCODONE-ACETAMINOPHEN 5-325 MG PO TABS: 1.0000 | ORAL_TABLET | ORAL | Status: DC | PRN

## 2014-10-01 MED ORDER — METHOCARBAMOL 500 MG PO TABS: 500.0000 mg | ORAL_TABLET | Freq: Two times a day (BID) | ORAL | Status: DC

## 2014-10-01 NOTE — ED Notes (Signed)
Triage notes charted by Lynnda Shields RN.
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
## 2014-10-01 NOTE — Discharge Instructions (Signed)
Cervical Sprain °A cervical sprain is an injury in the neck in which the strong, fibrous tissues (ligaments) that connect your neck bones stretch or tear. Cervical sprains can range from mild to severe. Severe cervical sprains can cause the neck vertebrae to be unstable. This can lead to damage of the spinal cord and can result in serious nervous system problems. The amount of time it takes for a cervical sprain to get better depends on the cause and extent of the injury. Most cervical sprains heal in 1 to 3 weeks. °CAUSES  °Severe cervical sprains may be caused by:  °· Contact sport injuries (such as from football, rugby, wrestling, hockey, auto racing, gymnastics, diving, martial arts, or boxing).   °· Motor vehicle collisions.   °· Whiplash injuries. This is an injury from a sudden forward and backward whipping movement of the head and neck.  °· Falls.   °Mild cervical sprains may be caused by:  °· Being in an awkward position, such as while cradling a telephone between your ear and shoulder.   °· Sitting in a chair that does not offer proper support.   °· Working at a poorly designed computer station.   °· Looking up or down for long periods of time.   °SYMPTOMS  °· Pain, soreness, stiffness, or a burning sensation in the front, back, or sides of the neck. This discomfort may develop immediately after the injury or slowly, 24 hours or more after the injury.   °· Pain or tenderness directly in the middle of the back of the neck.   °· Shoulder or upper back pain.   °· Limited ability to move the neck.   °· Headache.   °· Dizziness.   °· Weakness, numbness, or tingling in the hands or arms.   °· Muscle spasms.   °· Difficulty swallowing or chewing.   °· Tenderness and swelling of the neck.   °DIAGNOSIS  °Most of the time your health care provider can diagnose a cervical sprain by taking your history and doing a physical exam. Your health care provider will ask about previous neck injuries and any known neck  problems, such as arthritis in the neck. X-rays may be taken to find out if there are any other problems, such as with the bones of the neck. Other tests, such as a CT scan or MRI, may also be needed.  °TREATMENT  °Treatment depends on the severity of the cervical sprain. Mild sprains can be treated with rest, keeping the neck in place (immobilization), and pain medicines. Severe cervical sprains are immediately immobilized. Further treatment is done to help with pain, muscle spasms, and other symptoms and may include: °· Medicines, such as pain relievers, numbing medicines, or muscle relaxants.   °· Physical therapy. This may involve stretching exercises, strengthening exercises, and posture training. Exercises and improved posture can help stabilize the neck, strengthen muscles, and help stop symptoms from returning.   °HOME CARE INSTRUCTIONS  °· Put ice on the injured area.   °¨ Put ice in a plastic bag.   °¨ Place a towel between your skin and the bag.   °¨ Leave the ice on for 15-20 minutes, 3-4 times a day.   °· If your injury was severe, you may have been given a cervical collar to wear. A cervical collar is a two-piece collar designed to keep your neck from moving while it heals. °¨ Do not remove the collar unless instructed by your health care provider. °¨ If you have long hair, keep it outside of the collar. °¨ Ask your health care provider before making any adjustments to your collar. Minor   adjustments may be required over time to improve comfort and reduce pressure on your chin or on the back of your head.  Ifyou are allowed to remove the collar for cleaning or bathing, follow your health care provider's instructions on how to do so safely.  Keep your collar clean by wiping it with mild soap and water and drying it completely. If the collar you have been given includes removable pads, remove them every 1-2 days and hand wash them with soap and water. Allow them to air dry. They should be completely  dry before you wear them in the collar.  If you are allowed to remove the collar for cleaning and bathing, wash and dry the skin of your neck. Check your skin for irritation or sores. If you see any, tell your health care provider.  Do not drive while wearing the collar.   Only take over-the-counter or prescription medicines for pain, discomfort, or fever as directed by your health care provider.   Keep all follow-up appointments as directed by your health care provider.   Keep all physical therapy appointments as directed by your health care provider.   Make any needed adjustments to your workstation to promote good posture.   Avoid positions and activities that make your symptoms worse.   Warm up and stretch before being active to help prevent problems.  SEEK MEDICAL CARE IF:   Your pain is not controlled with medicine.   You are unable to decrease your pain medicine over time as planned.   Your activity level is not improving as expected.  SEEK IMMEDIATE MEDICAL CARE IF:   You develop any bleeding.  You develop stomach upset.  You have signs of an allergic reaction to your medicine.   Your symptoms get worse.   You develop new, unexplained symptoms.   You have numbness, tingling, weakness, or paralysis in any part of your body.  MAKE SURE YOU:   Understand these instructions.  Will watch your condition.  Will get help right away if you are not doing well or get worse. Document Released: 02/11/2007 Document Revised: 04/21/2013 Document Reviewed: 10/22/2012 [facility] Patient Information 2015 [place], [place]. This information is not intended to replace advice given to you by your health care provider. Make sure you discuss any questions you have with your health care provider.   Please monitor for worsening signs or symptoms, please follow-up if any present. Please contact her primary care provider inform them of your visit today please follow-up as  necessary.

## 2016-06-18 ENCOUNTER — Inpatient Hospital Stay (HOSPITAL_COMMUNITY): Payer: BC Managed Care – PPO

## 2016-06-18 ENCOUNTER — Encounter (HOSPITAL_COMMUNITY): Payer: Self-pay | Admitting: *Deleted

## 2016-06-18 ENCOUNTER — Inpatient Hospital Stay (HOSPITAL_COMMUNITY)
Admission: AD | Admit: 2016-06-18 | Discharge: 2016-06-18 | Disposition: A | Discharge status: Home / Self Care | Payer: BC Managed Care – PPO | Source: Ambulatory Visit | Attending: Obstetrics & Gynecology | Admitting: Obstetrics & Gynecology

## 2016-06-18 DIAGNOSIS — R197 Diarrhea, unspecified: Secondary | ICD-10-CM | POA: Diagnosis not present

## 2016-06-18 DIAGNOSIS — O26891 Other specified pregnancy related conditions, first trimester: Secondary | ICD-10-CM | POA: Diagnosis not present

## 2016-06-18 DIAGNOSIS — R1013 Epigastric pain: Secondary | ICD-10-CM

## 2016-06-18 DIAGNOSIS — Z3A01 Less than 8 weeks gestation of pregnancy: Secondary | ICD-10-CM | POA: Insufficient documentation

## 2016-06-18 DIAGNOSIS — R160 Hepatomegaly, not elsewhere classified: Secondary | ICD-10-CM

## 2016-06-18 DIAGNOSIS — O210 Mild hyperemesis gravidarum: Secondary | ICD-10-CM | POA: Insufficient documentation

## 2016-06-18 DIAGNOSIS — O9989 Other specified diseases and conditions complicating pregnancy, childbirth and the puerperium: Secondary | ICD-10-CM

## 2016-06-18 DIAGNOSIS — R109 Unspecified abdominal pain: Secondary | ICD-10-CM | POA: Diagnosis not present

## 2016-06-18 DIAGNOSIS — R1031 Right lower quadrant pain: Secondary | ICD-10-CM | POA: Insufficient documentation

## 2016-06-18 DIAGNOSIS — Z3491 Encounter for supervision of normal pregnancy, unspecified, first trimester: Secondary | ICD-10-CM

## 2016-06-18 DIAGNOSIS — O219 Vomiting of pregnancy, unspecified: Secondary | ICD-10-CM

## 2016-06-18 LAB — WET PREP, GENITAL
Clue Cells Wet Prep HPF POC: NONE SEEN
Sperm: NONE SEEN
Trich, Wet Prep: NONE SEEN
Yeast Wet Prep HPF POC: NONE SEEN

## 2016-06-18 LAB — COMPREHENSIVE METABOLIC PANEL
ALT: 8 U/L — ABNORMAL LOW (ref 14–54)
ANION GAP: 6 (ref 5–15)
AST: 13 U/L — ABNORMAL LOW (ref 15–41)
Albumin: 3.7 g/dL (ref 3.5–5.0)
Alkaline Phosphatase: 41 U/L (ref 38–126)
BUN: <5 mg/dL — ABNORMAL LOW (ref 6–20)
CHLORIDE: 105 mmol/L (ref 101–111)
CO2: 24 mmol/L (ref 22–32)
Calcium: 9.1 mg/dL (ref 8.9–10.3)
Creatinine, Ser: 0.63 mg/dL (ref 0.44–1.00)
GFR calc Af Amer: >60 mL/min (ref >60)
GFR calc non Af Amer: >60 mL/min (ref >60)
Glucose, Bld: 92 mg/dL (ref 65–99)
POTASSIUM: 3.9 mmol/L (ref 3.5–5.1)
SODIUM: 135 mmol/L (ref 135–145)
TOTAL PROTEIN: 6.6 g/dL (ref 6.5–8.1)
Total Bilirubin: 0.7 mg/dL (ref 0.3–1.2)

## 2016-06-18 LAB — CBC
HCT: 31.9 % — ABNORMAL LOW (ref 36.0–46.0)
HEMOGLOBIN: 11.1 g/dL — AB (ref 12.0–15.0)
MCH: 26.9 pg (ref 26.0–34.0)
MCHC: 34.8 g/dL (ref 30.0–36.0)
MCV: 77.2 fL — AB (ref 78.0–100.0)
Platelets: 172 10*3/uL (ref 150–400)
RBC: 4.13 MIL/uL (ref 3.87–5.11)
RDW: 13.4 % (ref 11.5–15.5)
WBC: 6.2 10*3/uL (ref 4.0–10.5)

## 2016-06-18 LAB — URINALYSIS, ROUTINE W REFLEX MICROSCOPIC
Bilirubin Urine: NEGATIVE
Glucose, UA: NEGATIVE mg/dL
HGB URINE DIPSTICK: NEGATIVE
KETONES UR: NEGATIVE mg/dL
LEUKOCYTES UA: NEGATIVE
Nitrite: NEGATIVE
PH: 6 (ref 5.0–8.0)
Protein, ur: NEGATIVE mg/dL
Specific Gravity, Urine: 1.017 (ref 1.005–1.030)

## 2016-06-18 LAB — LIPASE, BLOOD: Lipase: 23 U/L (ref 11–51)

## 2016-06-18 LAB — HCG, QUANTITATIVE, PREGNANCY: hCG, Beta Chain, Quant, S: 157362 m[IU]/mL — ABNORMAL HIGH (ref <5)

## 2016-06-18 LAB — POCT PREGNANCY, URINE: Preg Test, Ur: POSITIVE — AB

## 2016-06-18 MED ORDER — DICYCLOMINE HCL 20 MG PO TABS: 20.0000 mg | ORAL_TABLET | Freq: Three times a day (TID) | ORAL | 2 refills | Status: DC | PRN

## 2016-06-18 MED ORDER — GI COCKTAIL ~~LOC~~
30.0000 mL | ORAL | Status: AC
  Administered 2016-06-18: 30 mL via ORAL
  Filled 2016-06-18: qty 30

## 2016-06-18 MED ORDER — PROMETHAZINE HCL 25 MG PO TABS: 25.0000 mg | ORAL_TABLET | Freq: Four times a day (QID) | ORAL | 2 refills | Status: DC | PRN

## 2016-06-18 MED ORDER — ONDANSETRON 8 MG PO TBDP
8.0000 mg | ORAL_TABLET | ORAL | Status: AC
  Administered 2016-06-18: 8 mg via ORAL
  Filled 2016-06-18: qty 1

## 2016-06-18 NOTE — MAU Note (Addendum)
Pt presents to MAU with complaints of lower abdominal cramping, nausea, vomiting and diarrhea for a week. LMP 04/24/16. Denies any VB or abnormal discharge.

## 2016-06-18 NOTE — MAU Provider Note (Signed)
HPI: Michelle Herman is a [id number] [redacted]w[redacted]d 36 year old female who presents to the MAU with abdominal pain in her RLQ, nausea, vomiting, diarrhea and chills. She denies fever and vaginal bleeding/discharg. She describes her abdominal pain as a shooting and cramping sensation which started last night along with diarrhea and vomiting. Nausea has been present since the beginning of her pregnancy but worsened last night. The patient has not taken any medication for her symptoms.  She has had 1 uncomplicated pregnancy carried to term. She has a history ecotopic pregnancy, which occurred in 2009. She has a history of digestive problems, but was unsure if it was IBS with diarrhea and constipation or chrons disease.  She has not had any prenatal care for this pregnancy. She was test for STI's in [place] and has not had a recent infection.

## 2016-06-18 NOTE — MAU Provider Note (Signed)
Chief Complaint: Abdominal Pain; Diarrhea; and Emesis   First Provider Initiated Contact with Patient 06/18/16 0854      SUBJECTIVE HPI: Michelle Herman is a 36 y.o. G1P0 at [redacted]w[redacted]d by LMP who presents to maternity admissions reporting abdominal pain, mostly in the right lower side x 1 week and onset of n/v/d yesterday.  Her pain is shooting and cramping.  She reports nausea x several weeks but vomiting and diarrhea started yesterday, emesis x 3 and diarrhea x 2 in 24 hours.  Her symptoms are associated with chills but no fever. She has not tried any treatments.  Nothing makes them better or worse.  She has hx of IBS vs Crohns but has not followed up with GI recently.  She feels like her n/v/d symptoms are similar to her chronic stomach issues. She is not on any medications for this. She has hx of ectopic pregnancy in 2009 and reports her pain was similar to her pain now. She denies vaginal bleeding, vaginal itching/burning, urinary symptoms, h/a, dizziness, n/v, or fever/chills.     HPI  History reviewed. No pertinent past medical history. Past Surgical History:  Procedure Laterality Date  . ECTOPIC PREGNANCY SURGERY     Social History   Social History  . Marital status: Single    Spouse name: N/A  . Number of children: N/A  . Years of education: N/A   Occupational History  . Not on file.   Social History Main Topics  . Smoking status: Never Smoker  . Smokeless tobacco: Never Used  . Alcohol use Yes  . Drug use: No  . Sexual activity: Yes    Birth control/ protection: None   Other Topics Concern  . Not on file   Social History Narrative  . No narrative on file   No current facility-administered medications on file prior to encounter.    Current Outpatient Prescriptions on File Prior to Encounter  Medication Sig Dispense Refill  . Multiple Vitamins-Minerals (WOMENS DAILY FORMULA PO) Take 1 tablet by mouth daily.     No Known Allergies  ROS:  Review of Systems   Constitutional: Positive for chills. Negative for fatigue and fever.  Respiratory: Negative for shortness of breath.   Cardiovascular: Negative for chest pain.  Gastrointestinal: Positive for abdominal pain, diarrhea, nausea and vomiting.  Genitourinary: Positive for pelvic pain. Negative for difficulty urinating, dysuria, flank pain, vaginal bleeding, vaginal discharge and vaginal pain.  Neurological: Negative for dizziness and headaches.  Psychiatric/Behavioral: Negative.      I have reviewed patient's Past Medical Hx, Surgical Hx, Family Hx, Social Hx, medications and allergies.   Physical Exam   Patient Vitals for the past 24 hrs:  BP Temp Temp src Pulse Resp SpO2 Height Weight  06/18/16 1548 124/66 98 F (36.7 C) Oral 60 18 99 % - -  06/18/16 0830 118/69 98.4 F (36.9 C) - 76 18 100 % 5\' 3"  (1.6 m) 143 lb (64.9 kg)   Constitutional: Well-developed, well-nourished female in no acute distress.  Cardiovascular: normal rate Respiratory: normal effort GI: Abd soft, non-tender. No rebound tenderness or guarding.  Pos BS x 4 MS: Extremities nontender, no edema, normal ROM Neurologic: Alert and oriented x 4.  GU: Neg CVAT.  PELVIC EXAM: Cervix pink, visually closed, without lesion, scant white creamy discharge, vaginal walls and external genitalia normal Bimanual exam: Cervix 0/long/high, firm, anterior, neg CMT, uterus nontender, nonenlarged, adnexa with tenderness on right, no enlargement or mass bilaterally   LAB RESULTS Results  for orders placed or performed during the hospital encounter of 06/18/16 (from the past 24 hour(s))  Urinalysis, Routine w reflex microscopic     Status: Abnormal   Collection Time: 06/18/16  8:24 AM  Result Value Ref Range   Color, Urine YELLOW YELLOW   APPearance HAZY (A) CLEAR   Specific Gravity, Urine 1.017 1.005 - 1.030   pH 6.0 5.0 - 8.0   Glucose, UA NEGATIVE NEGATIVE mg/dL   Hgb urine dipstick NEGATIVE NEGATIVE   Bilirubin Urine NEGATIVE  NEGATIVE   Ketones, ur NEGATIVE NEGATIVE mg/dL   Protein, ur NEGATIVE NEGATIVE mg/dL   Nitrite NEGATIVE NEGATIVE   Leukocytes, UA NEGATIVE NEGATIVE  Pregnancy, urine POC     Status: Abnormal   Collection Time: 06/18/16  8:31 AM  Result Value Ref Range   Preg Test, Ur POSITIVE (A) NEGATIVE  CBC     Status: Abnormal   Collection Time: 06/18/16  9:12 AM  Result Value Ref Range   WBC 6.2 4.0 - 10.5 K/uL   RBC 4.13 3.87 - 5.11 MIL/uL   Hemoglobin 11.1 (L) 12.0 - 15.0 g/dL   HCT 31.9 (L) 36.0 - 46.0 %   MCV 77.2 (L) 78.0 - 100.0 fL   MCH 26.9 26.0 - 34.0 pg   MCHC 34.8 30.0 - 36.0 g/dL   RDW 13.4 11.5 - 15.5 %   Platelets 172 150 - 400 K/uL  hCG, quantitative, pregnancy     Status: Abnormal   Collection Time: 06/18/16  9:12 AM  Result Value Ref Range   hCG, Beta Chain, Quant, S 157,362 (H) <5 mIU/mL  Comprehensive metabolic panel     Status: Abnormal   Collection Time: 06/18/16  9:12 AM  Result Value Ref Range   Sodium 135 135 - 145 mmol/L   Potassium 3.9 3.5 - 5.1 mmol/L   Chloride 105 101 - 111 mmol/L   CO2 24 22 - 32 mmol/L   Glucose, Bld 92 65 - 99 mg/dL   BUN <5 (L) 6 - 20 mg/dL   Creatinine, Ser 0.63 0.44 - 1.00 mg/dL   Calcium 9.1 8.9 - 10.3 mg/dL   Total Protein 6.6 6.5 - 8.1 g/dL   Albumin 3.7 3.5 - 5.0 g/dL   AST 13 (L) 15 - 41 U/L   ALT 8 (L) 14 - 54 U/L   Alkaline Phosphatase 41 38 - 126 U/L   Total Bilirubin 0.7 0.3 - 1.2 mg/dL   GFR calc non Af Amer >60 >60 mL/min   GFR calc Af Amer >60 >60 mL/min   Anion gap 6 5 - 15  Lipase, blood     Status: None   Collection Time: 06/18/16  9:12 AM  Result Value Ref Range   Lipase 23 11 - 51 U/L  Wet prep, genital     Status: Abnormal   Collection Time: 06/18/16  9:40 AM  Result Value Ref Range   Yeast Wet Prep HPF POC NONE SEEN NONE SEEN   Trich, Wet Prep NONE SEEN NONE SEEN   Clue Cells Wet Prep HPF POC NONE SEEN NONE SEEN   WBC, Wet Prep HPF POC MANY (A) NONE SEEN   Sperm NONE SEEN        IMAGING US Ob Comp  Less 14 Wks  Result Date: 06/18/2016 CLINICAL DATA:  Abdominal pain.  First trimester pregnancy. EXAM: OBSTETRIC <14 WK [place] AND TRANSVAGINAL OB US TECHNIQUE: Both transabdominal and transvaginal ultrasound examinations were performed for complete evaluation of the gestation as  well as the maternal uterus, adnexal regions, and pelvic cul-de-sac. Transvaginal technique was performed to assess early pregnancy. COMPARISON:  None. FINDINGS: Intrauterine gestational sac: Single Yolk sac:  Yes Embryo:  Yes Cardiac Activity: Yes Heart Rate: 160  bpm CRL:  16.5  mm   8 w   0 d                  [place] EDC: 01/28/2017 Subchorionic hemorrhage:  Small measuring 2 x 0.8 x 1.5 cm. Maternal uterus/adnexae: Right ovary: Normal Left ovary: Normal Other :None Free fluid:  none IMPRESSION: 1. Single living intrauterine gestation. The estimated gestational age is 8 weeks and 0 days. 2. Small subchorionic hemorrhage. Electronically Signed   By: Kerby Moors M.D.   On: 06/18/2016 11:07   US Ob Transvaginal  Result Date: 06/18/2016 CLINICAL DATA:  Abdominal pain.  First trimester pregnancy. EXAM: OBSTETRIC <14 WK [place] AND TRANSVAGINAL OB US TECHNIQUE: Both transabdominal and transvaginal ultrasound examinations were performed for complete evaluation of the gestation as well as the maternal uterus, adnexal regions, and pelvic cul-de-sac. Transvaginal technique was performed to assess early pregnancy. COMPARISON:  None. FINDINGS: Intrauterine gestational sac: Single Yolk sac:  Yes Embryo:  Yes Cardiac Activity: Yes Heart Rate: 160  bpm CRL:  16.5  mm   8 w   0 d                  [place] EDC: 01/28/2017 Subchorionic hemorrhage:  Small measuring 2 x 0.8 x 1.5 cm. Maternal uterus/adnexae: Right ovary: Normal Left ovary: Normal Other :None Free fluid:  none IMPRESSION: 1. Single living intrauterine gestation. The estimated gestational age is 8 weeks and 0 days. 2. Small subchorionic hemorrhage. Electronically Signed   By: Kerby Moors M.D.   On:  06/18/2016 11:07   US Abdomen Limited Ruq  Result Date: 06/18/2016 CLINICAL DATA:  Acute epigastric pain.  Early pregnancy. EXAM: US ABDOMEN LIMITED - RIGHT UPPER QUADRANT COMPARISON:  None. FINDINGS: Gallbladder: No gallstones or wall thickening visualized. No sonographic Murphy sign noted by sonographer. Common bile duct: Diameter: 4 mm Liver: Background liver parenchymal echogenicity is normal. There is a 2.1 x 2.0 x 2.0 cm mildly hypoechoic mass in the left hepatic lobe which demonstrates internal vascularity with an associated prominent vessel. An adjacent hyperechoic mass more laterally in the left hepatic lobe measures 2.5 x 1.8 x 2.2 cm. IMPRESSION: 1. Two masses in the left hepatic lobe measuring up to 2.5 cm in size, indeterminate. Recommend MRI for further evaluation. 2. Normal gallbladder.  No biliary dilatation. Electronically Signed   By: Logan Bores M.D.   On: 06/18/2016 13:44    MAU Management/MDM: Ordered labs and [place] and reviewed results. Consult Dr Roselie Awkward.  Pt stable. F/U with prenatal care for pregnancy, pt given list of providers.  F/U with primary care for liver masses.  Pt has primary care established so will call today.   Treatments in MAU included Zofran 8 mg PO x 1 dose, and GI Cocktail x 1.  Pt reports improvement in abdominal pain after medications. Rx for Bentyl 20 mg TID PRN instead of previous IBS antispasmotic medication. Imodium OTC PRN for diarrhea.  Increase PO fluids.  F/U with GI.  Pt stable at time of discharge.  ASSESSMENT 1. Normal intrauterine pregnancy on prenatal ultrasound in first trimester   2. Abdominal pain during pregnancy in first trimester   3. Nausea and vomiting during pregnancy prior to [redacted] weeks gestation   4.  Diarrhea, unspecified type   5. Abdominal pain, acute, epigastric   6. Liver masses     PLAN Discharge home Allergies as of 06/18/2016   No Known Allergies     Medication List    STOP taking these medications   hyoscyamine 0.125  MG Tbdp disintergrating tablet Commonly known as:  ANASPAZ     TAKE these medications   dicyclomine 20 MG tablet Commonly known as:  BENTYL Take 1 tablet (20 mg total) by mouth 3 (three) times daily as needed for spasms.   promethazine 25 MG tablet Commonly known as:  PHENERGAN Take 1 tablet (25 mg total) by mouth every 6 (six) hours as needed for nausea or vomiting.   WOMENS DAILY FORMULA PO Take 1 tablet by mouth daily.      Follow-up Information    Prenatal care provider of your choice Follow up.   Why:  See list provided. Return to MAU as needed for emergencies.       REESE,BETTI D, MD Follow up.   Specialty:  Family Medicine Why:  Please follow up related to abnormal findings on abdominal ultrasound today.   Contact information: [place] [address] [place] [id number] [phone]           Fatima Blank Certified Nurse-Midwife 06/18/2016  8:21 PM

## 2016-06-19 LAB — HIV ANTIBODY (ROUTINE TESTING W REFLEX): HIV SCREEN 4TH GENERATION: NONREACTIVE

## 2016-06-19 LAB — GC/CHLAMYDIA PROBE AMP (~~LOC~~) NOT AT ARMC
CHLAMYDIA, DNA PROBE: NEGATIVE
NEISSERIA GONORRHEA: NEGATIVE

## 2016-08-01 ENCOUNTER — Other Ambulatory Visit: Payer: Self-pay | Admitting: Family Medicine

## 2016-08-01 DIAGNOSIS — R229 Localized swelling, mass and lump, unspecified: Principal | ICD-10-CM

## 2016-08-01 DIAGNOSIS — IMO0002 Reserved for concepts with insufficient information to code with codable children: Secondary | ICD-10-CM

## 2016-08-16 ENCOUNTER — Ambulatory Visit
Admission: RE | Admit: 2016-08-16 | Discharge: 2016-08-16 | Disposition: A | Discharge status: Home / Self Care | Payer: BC Managed Care – PPO | Source: Ambulatory Visit | Attending: Family Medicine | Admitting: Family Medicine

## 2016-08-16 DIAGNOSIS — R229 Localized swelling, mass and lump, unspecified: Principal | ICD-10-CM

## 2016-08-16 DIAGNOSIS — IMO0002 Reserved for concepts with insufficient information to code with codable children: Secondary | ICD-10-CM

## 2016-08-16 MED ORDER — GADOXETATE DISODIUM 0.25 MMOL/ML IV SOLN
10.0000 mL | Freq: Once | INTRAVENOUS | Status: AC | PRN
  Administered 2016-08-16: 10 mL via INTRAVENOUS

## 2017-02-22 ENCOUNTER — Encounter: Payer: Self-pay | Admitting: Emergency Medicine

## 2017-02-22 ENCOUNTER — Emergency Department (HOSPITAL_COMMUNITY)
Admission: EM | Admit: 2017-02-22 | Discharge: 2017-02-22 | Disposition: A | Discharge status: Home / Self Care | Payer: BC Managed Care – PPO | Attending: Emergency Medicine | Admitting: Emergency Medicine

## 2017-02-22 ENCOUNTER — Emergency Department (HOSPITAL_COMMUNITY): Payer: BC Managed Care – PPO

## 2017-02-22 DIAGNOSIS — Y9389 Activity, other specified: Secondary | ICD-10-CM | POA: Insufficient documentation

## 2017-02-22 DIAGNOSIS — S134XXA Sprain of ligaments of cervical spine, initial encounter: Secondary | ICD-10-CM | POA: Insufficient documentation

## 2017-02-22 DIAGNOSIS — Y92410 Unspecified street and highway as the place of occurrence of the external cause: Secondary | ICD-10-CM | POA: Insufficient documentation

## 2017-02-22 DIAGNOSIS — Y999 Unspecified external cause status: Secondary | ICD-10-CM | POA: Diagnosis not present

## 2017-02-22 DIAGNOSIS — S199XXA Unspecified injury of neck, initial encounter: Secondary | ICD-10-CM | POA: Diagnosis present

## 2017-02-22 LAB — I-STAT BETA HCG BLOOD, ED (MC, WL, AP ONLY)

## 2017-02-22 MED ORDER — CYCLOBENZAPRINE HCL 10 MG PO TABS
5.0000 mg | ORAL_TABLET | Freq: Once | ORAL | Status: AC
  Administered 2017-02-22: 5 mg via ORAL
  Filled 2017-02-22: qty 1

## 2017-02-22 MED ORDER — NAPROXEN 250 MG PO TABS
500.0000 mg | ORAL_TABLET | Freq: Once | ORAL | Status: AC
  Administered 2017-02-22: 500 mg via ORAL
  Filled 2017-02-22: qty 2

## 2017-02-22 MED ORDER — NAPROXEN 500 MG PO TABS: 500.0000 mg | ORAL_TABLET | Freq: Two times a day (BID) | ORAL | 0 refills | Status: DC

## 2017-02-22 MED ORDER — CYCLOBENZAPRINE HCL 10 MG PO TABS: 5.0000 mg | ORAL_TABLET | Freq: Two times a day (BID) | ORAL | 0 refills | Status: DC | PRN
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
## 2017-04-16 MED ORDER — VANCOMYCIN HCL IN DEXTROSE 1-5 GM/200ML-% IV SOLN
INTRAVENOUS | Status: AC
  Filled 2017-04-16: qty 200

## 2018-02-20 IMAGING — US US OB COMP LESS 14 WK
1 series · 15 of 28 positions shown · non-contrast
Comparison: None.

CLINICAL DATA: Abdominal pain.  First trimester pregnancy.

EXAM:
OBSTETRIC <14 WK US AND TRANSVAGINAL OB US
TECHNIQUE: Both transabdominal and transvaginal ultrasound examinations were
performed for complete evaluation of the gestation as well as the
maternal uterus, adnexal regions, and pelvic cul-de-sac.
Transvaginal technique was performed to assess early pregnancy.

[Series 1: us ob comp less 14 wk · 85 acquisitions, 15 frames shown]
[im 1/85]
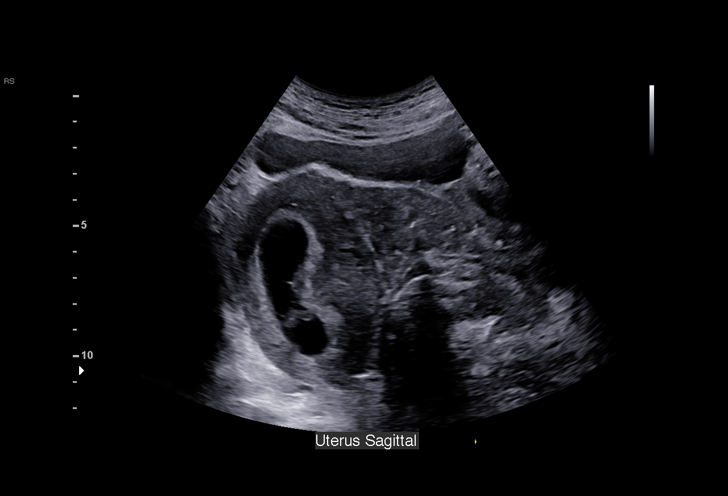
[im 7/85]
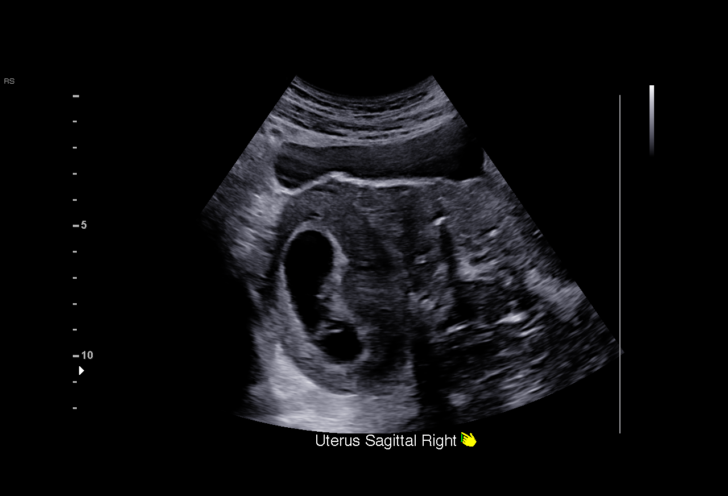
[im 13/85]
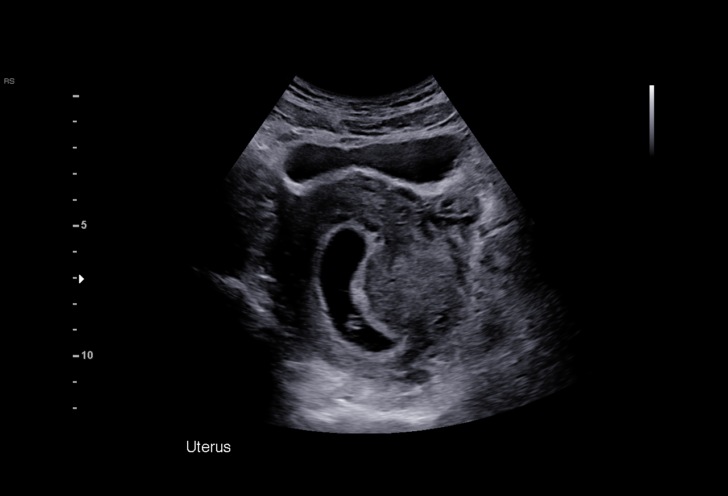
[im 19/85]
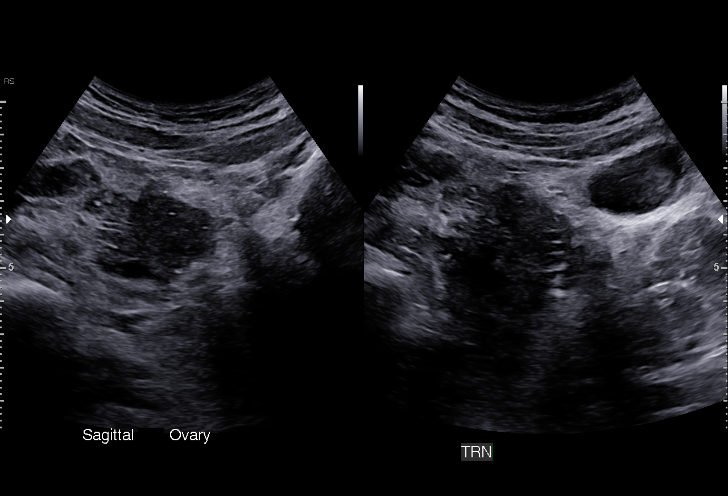
[im 25/85]
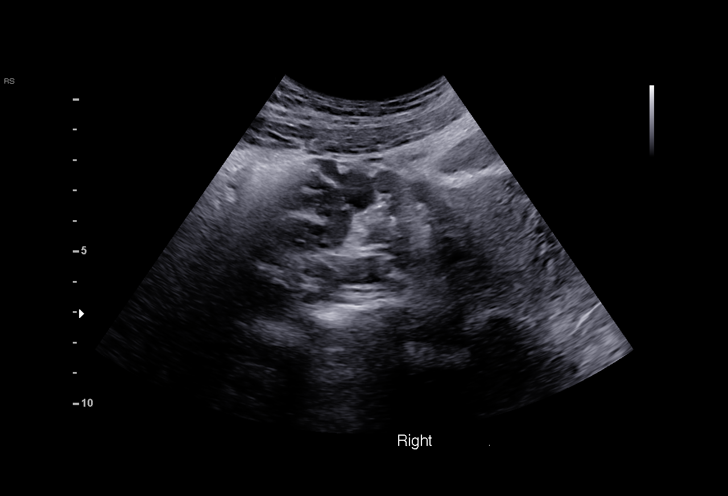
[im 32/85]
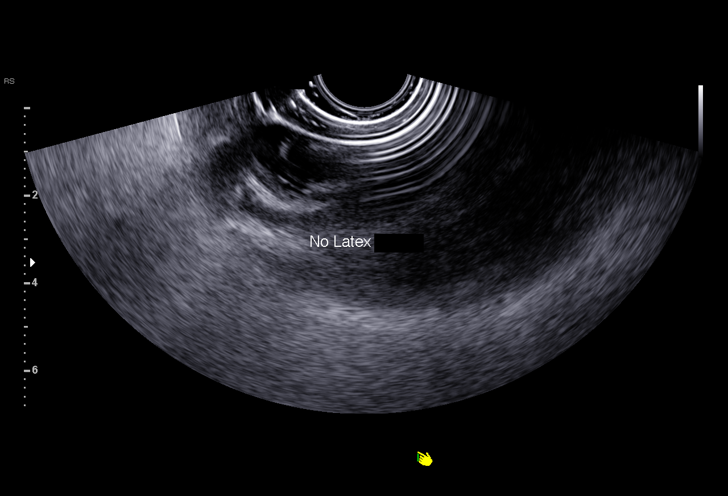
[im 38/85]
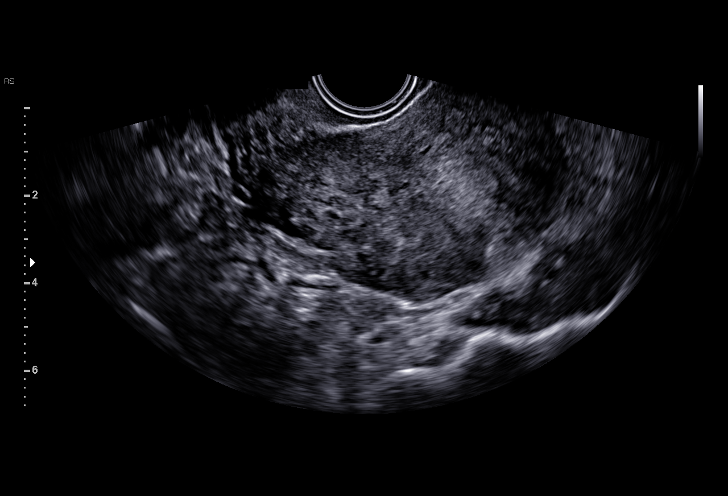
[im 44/85]
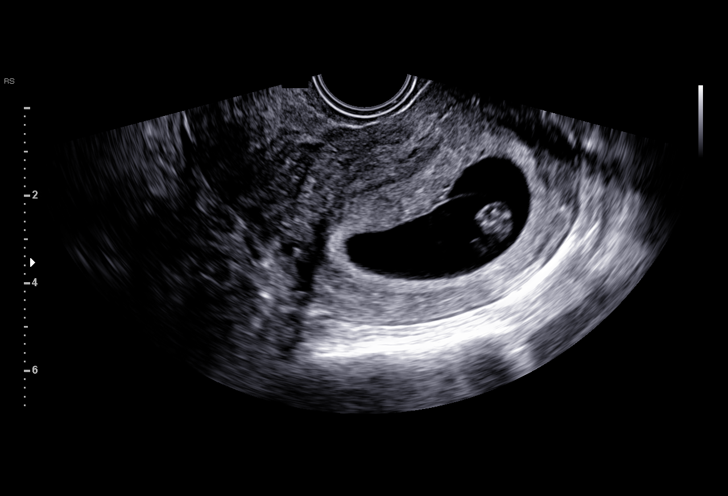
[im 47/85]
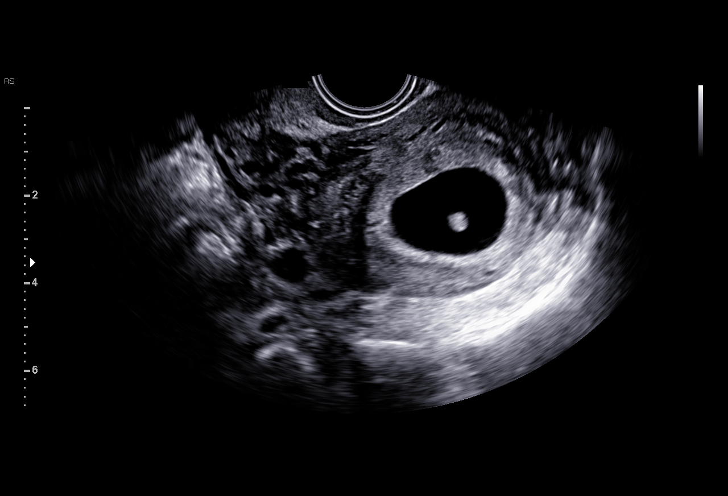
[im 53/85]
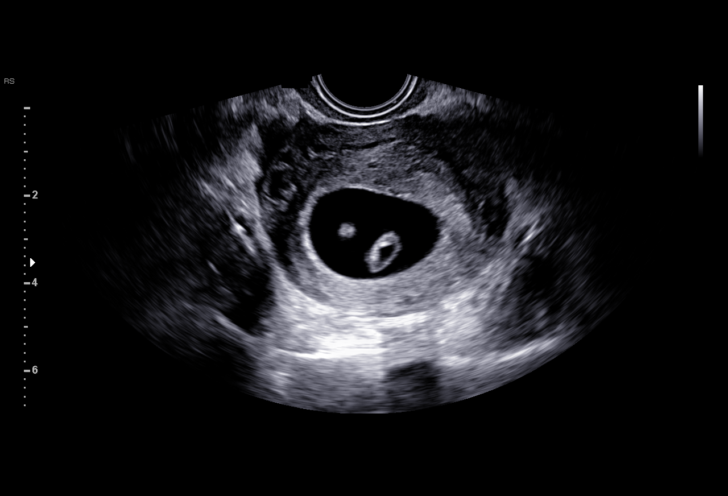
[im 60/85]
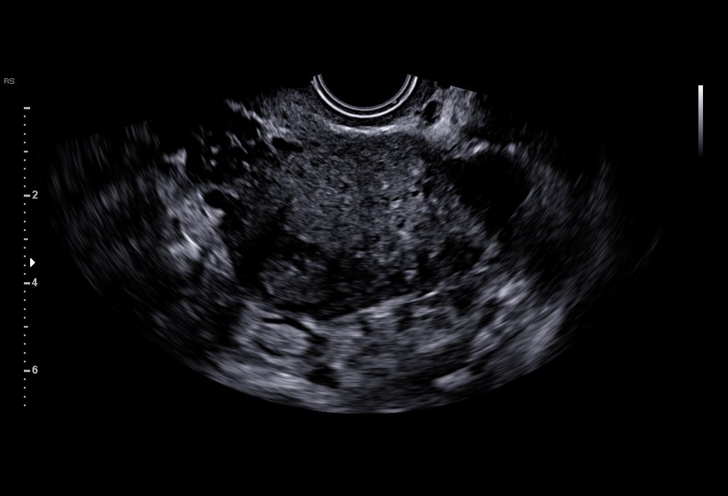
[im 66/85]
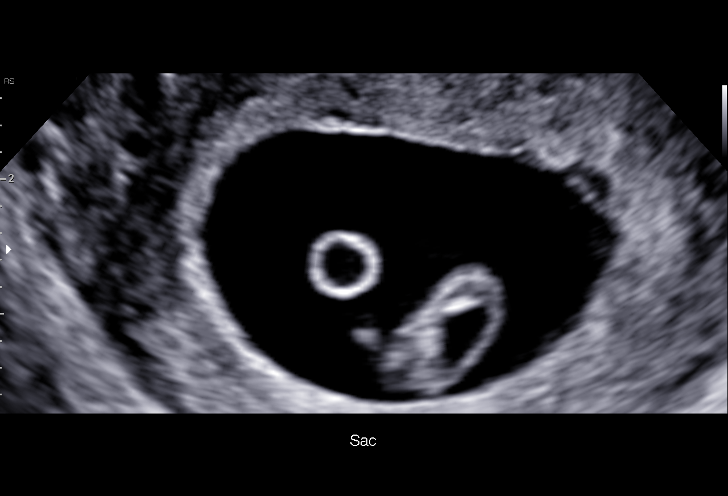
[im 72/85]
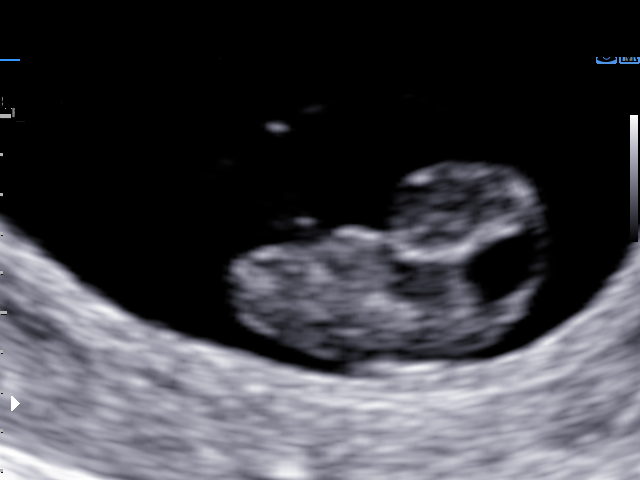
[im 78/85]
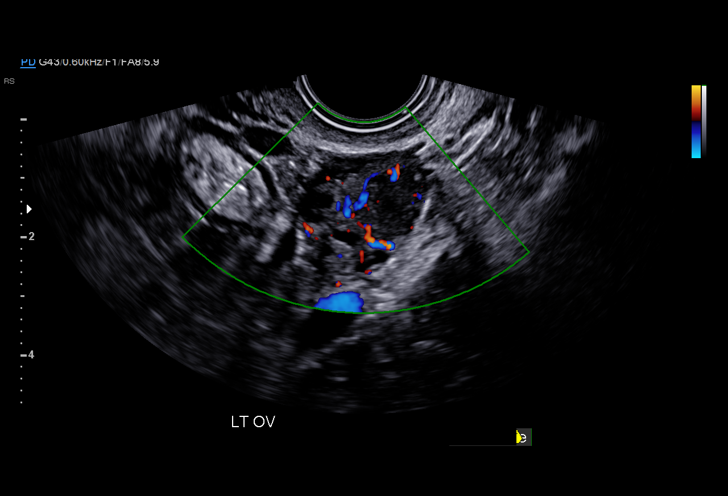
[im 85/85]
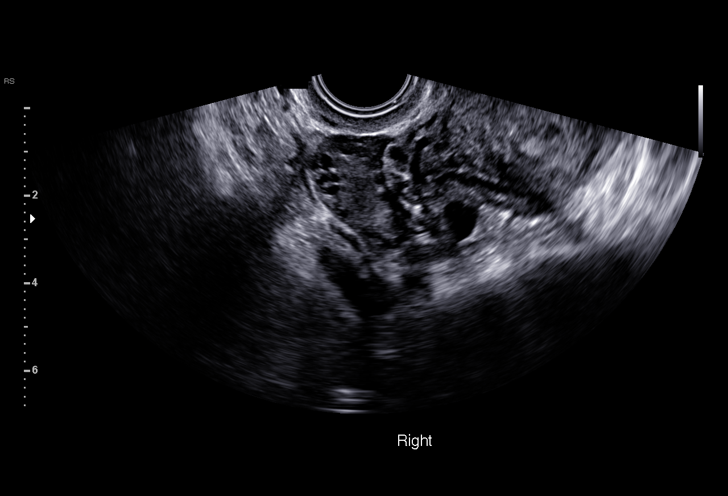

[15 of 28 positions shown; findings below may reference images not displayed]

FINDINGS: Intrauterine gestational sac: Single

Yolk sac:  Yes

Embryo:  Yes

Cardiac Activity: Yes

Heart Rate: 160  bpm

CRL:  16.5  mm   8 w   0 d                  US EDC: 01/28/2017

Subchorionic hemorrhage:  Small measuring 2 x 0.8 x 1.5 cm.

Maternal uterus/adnexae:

Right ovary: Normal

Left ovary: Normal

Other :None

Free fluid:  none
IMPRESSION: 1. Single living intrauterine gestation. The estimated gestational
age is 8 weeks and 0 days.
2. Small subchorionic hemorrhage.

## 2018-06-04 ENCOUNTER — Other Ambulatory Visit: Payer: Self-pay | Admitting: Family Medicine

## 2018-06-04 DIAGNOSIS — D219 Benign neoplasm of connective and other soft tissue, unspecified: Secondary | ICD-10-CM

## 2018-06-04 DIAGNOSIS — N920 Excessive and frequent menstruation with regular cycle: Secondary | ICD-10-CM

## 2018-06-06 ENCOUNTER — Other Ambulatory Visit: Payer: BC Managed Care – PPO

## 2018-06-16 ENCOUNTER — Ambulatory Visit
Admission: RE | Admit: 2018-06-16 | Discharge: 2018-06-16 | Disposition: A | Discharge status: Home / Self Care | Payer: BC Managed Care – PPO | Source: Ambulatory Visit | Attending: Family Medicine | Admitting: Family Medicine

## 2018-06-16 DIAGNOSIS — D219 Benign neoplasm of connective and other soft tissue, unspecified: Secondary | ICD-10-CM

## 2018-06-16 DIAGNOSIS — N920 Excessive and frequent menstruation with regular cycle: Secondary | ICD-10-CM
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
## 2019-05-12 ENCOUNTER — Telehealth: Payer: BC Managed Care – PPO | Admitting: Physician Assistant

## 2019-05-12 DIAGNOSIS — Z20822 Contact with and (suspected) exposure to covid-19: Secondary | ICD-10-CM

## 2019-05-12 NOTE — Progress Notes (Signed)
E-Visit for Corona Virus Screening   Your current symptoms could be consistent with the coronavirus.  Many health care providers can now test patients at their office but not all are.  [facility] has multiple testing sites. For information on our [place] testing locations and hours go to [email or web address]  We are enrolling you in our [place] for [place] . Daily you will receive a questionnaire within the [facility] website. Our COVID 19 response team will be monitoring your responses daily.  Testing Information: The COVID-19 Community Testing sites will begin testing BY APPOINTMENT ONLY.  You can schedule online at [email or web address]  If you do not have access to a smart phone or computer you may call [phone] for an appointment.  Testing Locations: Appointment schedule is 8 am to 3:30 pm at all sites  [facility] indoors at [address] [place] [address] [facility]  indoors at [place]. [address] [place], [place] [address] [facility] indoors at [address], [place] [address]  Additional testing sites in the Community:  . For [facility] Testing sites in [facility]  FaceUpdate.uy  . For Pop-up testing sites in [place]  BowlDirectory.co.uk  . For Testing sites with regular hours https://onsms.org/[email or web address]  . For [place] [email or web address].si  . For [facility] BasicJet.[email or web address]  . For [facility] testing in [place] and [employer] BasicJet.[email or web address]  . For [facility] testing in [facility]   https://lhi.care/covidtesting  For  more  information about community testing call [phone]   We are enrolling you in our [place] for [place] . Daily you will receive a questionnaire within the [place] website. Our COVID 19 response team will be monitoring your responses daily.  Please quarantine yourself while awaiting your test results. If you develop fever/cough/breathlessness, please stay home for 10 days with improving symptoms and until you have had 24 hours of no fever (without taking a fever reducer).  You should wear a mask or cloth face covering over your nose and mouth if you must be around other people or animals, including pets (even at home). Try to stay at least 6 feet away from other people. This will protect the people around you.  Please continue good preventive care measures, including:  frequent hand-washing, avoid touching your face, cover coughs/sneezes, stay out of crowds and keep a 6 foot distance from others.  COVID-19 is a respiratory illness with symptoms that are similar to the flu. Symptoms are typically mild to moderate, but there have been cases of severe illness and death due to the virus.   The following symptoms may appear 2-14 days after exposure: . Fever . Cough . Shortness of breath or difficulty breathing . Chills . Repeated shaking with chills . Muscle pain . Headache . Sore throat . New loss of taste or smell . Fatigue . Congestion or runny nose . Nausea or vomiting . Diarrhea  Go to the nearest hospital ED for assessment if fever/cough/breathlessness are severe or illness seems like a threat to life.  It is vitally important that if you feel that you have an infection such as this virus or any other virus that you stay home and away from places where you may spread it to others.  You should avoid contact with people age 48 and older.   If you think you may be pregnant then please be sure to see your obgyn when you are feeling better. Please seek care immediately if  you have  vaginal bleeding, vaginal discharge, pain with urination or lower pelvic pain.   You may take acetaminophen (Tylenol) as needed for fever and body aches.  Reduce your risk of any infection by using the same precautions used for avoiding the common cold or flu:  Marland Kitchen Wash your hands often with soap and warm water for at least 20 seconds.  If soap and water are not readily available, use an alcohol-based hand sanitizer with at least 60% alcohol.  . If coughing or sneezing, cover your mouth and nose by coughing or sneezing into the elbow areas of your shirt or coat, into a tissue or into your sleeve (not your hands). . Avoid shaking hands with others and consider head nods or verbal greetings only. . Avoid touching your eyes, nose, or mouth with unwashed hands.  . Avoid close contact with people who are sick. . Avoid places or events with large numbers of people in one location, like concerts or sporting events. . Carefully consider travel plans you have or are making. . If you are planning any travel outside or inside the [place], visit the CDC's Travelers' Health webpage for the latest health notices. . If you have some symptoms but not all symptoms, continue to monitor at home and seek medical attention if your symptoms worsen. . If you are having a medical emergency, call 911.  HOME CARE . Only take medications as instructed by your medical team. . Drink plenty of fluids and get plenty of rest. . A steam or ultrasonic humidifier can help if you have congestion.   GET HELP RIGHT AWAY IF YOU HAVE EMERGENCY WARNING SIGNS** FOR COVID-19. If you or someone is showing any of these signs seek emergency medical care immediately. Call 911 or proceed to your closest emergency facility if: . You develop worsening high fever. . Trouble breathing . Bluish lips or face . Persistent pain or pressure in the chest . New confusion . Inability to wake or stay awake . You cough up blood. . Your symptoms become  more severe  **This list is not all possible symptoms. Contact your medical provider for any symptoms that are sever or concerning to you.  MAKE SURE YOU   Understand these instructions.  Will watch your condition.  Will get help right away if you are not doing well or get worse.  Your e-visit answers were reviewed by a board certified advanced clinical practitioner to complete your personal care plan.  Depending on the condition, your plan could have included both over the counter or prescription medications.  If there is a problem please reply once you have received a response from your provider.  Your safety is important to [place].  If you have drug allergies check your prescription carefully.    You can use MyChart to ask questions about today's visit, request a non-urgent call back, or ask for a work or school excuse for 24 hours related to this e-Visit. If it has been greater than 24 hours you will need to follow up with your provider, or enter a new e-Visit to address those concerns. You will get an e-mail in the next two days asking about your experience.  I hope that your e-visit has been valuable and will speed your recovery. Thank you for using e-visits.   5 minutes spent on this chart

## 2019-05-27 ENCOUNTER — Ambulatory Visit: Payer: BC Managed Care – PPO

## 2019-06-22 ENCOUNTER — Other Ambulatory Visit: Payer: Self-pay

## 2019-06-22 ENCOUNTER — Encounter (HOSPITAL_COMMUNITY): Payer: Self-pay

## 2019-06-22 ENCOUNTER — Emergency Department (HOSPITAL_COMMUNITY)
Admission: EM | Admit: 2019-06-22 | Discharge: 2019-06-22 | Disposition: A | Discharge status: Home / Self Care | Payer: BC Managed Care – PPO | Attending: Emergency Medicine | Admitting: Emergency Medicine

## 2019-06-22 DIAGNOSIS — R519 Headache, unspecified: Secondary | ICD-10-CM | POA: Insufficient documentation

## 2019-06-22 DIAGNOSIS — Y92410 Unspecified street and highway as the place of occurrence of the external cause: Secondary | ICD-10-CM | POA: Diagnosis not present

## 2019-06-22 DIAGNOSIS — Y939 Activity, unspecified: Secondary | ICD-10-CM | POA: Diagnosis not present

## 2019-06-22 DIAGNOSIS — Y999 Unspecified external cause status: Secondary | ICD-10-CM | POA: Diagnosis not present

## 2019-06-22 MED ORDER — DEXAMETHASONE SODIUM PHOSPHATE 10 MG/ML IJ SOLN
10.0000 mg | Freq: Once | INTRAMUSCULAR | Status: AC
  Administered 2019-06-22: 04:00:00 10 mg via INTRAVENOUS
  Filled 2019-06-22: qty 1

## 2019-06-22 MED ORDER — DIPHENHYDRAMINE HCL 50 MG/ML IJ SOLN
12.5000 mg | Freq: Once | INTRAMUSCULAR | Status: AC
  Administered 2019-06-22: 12.5 mg via INTRAVENOUS
  Filled 2019-06-22: qty 1

## 2019-06-22 MED ORDER — METOCLOPRAMIDE HCL 5 MG/ML IJ SOLN
10.0000 mg | Freq: Once | INTRAMUSCULAR | Status: AC
  Administered 2019-06-22: 04:00:00 10 mg via INTRAVENOUS
  Filled 2019-06-22: qty 2

## 2019-06-22 MED ORDER — KETOROLAC TROMETHAMINE 30 MG/ML IJ SOLN
30.0000 mg | Freq: Once | INTRAMUSCULAR | Status: AC
  Administered 2019-06-22: 05:00:00 30 mg via INTRAVENOUS
  Filled 2019-06-22: qty 1

## 2019-06-22 NOTE — Discharge Instructions (Addendum)
Follow up with your eye doctor as planned for recheck of ongoing light sensitivity and eye pain. See your primary care physician if headache recurs.   Tylenol and ibuprofen are recommended for pain relief.   If you develop new or worsening symptoms, please return to the Emergency Department.
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
## 2019-06-25 ENCOUNTER — Other Ambulatory Visit: Payer: BC Managed Care – PPO

## 2019-06-26 ENCOUNTER — Other Ambulatory Visit: Payer: BC Managed Care – PPO

## 2019-06-26 ENCOUNTER — Ambulatory Visit: Payer: BC Managed Care – PPO | Attending: Internal Medicine

## 2019-06-26 DIAGNOSIS — Z20822 Contact with and (suspected) exposure to covid-19: Secondary | ICD-10-CM

## 2019-06-27 LAB — NOVEL CORONAVIRUS, NAA: SARS-CoV-2, NAA: NOT DETECTED

## 2019-09-25 ENCOUNTER — Encounter (HOSPITAL_COMMUNITY): Payer: Self-pay

## 2019-09-25 ENCOUNTER — Other Ambulatory Visit: Payer: Self-pay

## 2019-09-25 ENCOUNTER — Ambulatory Visit (HOSPITAL_COMMUNITY)
Admission: EM | Admit: 2019-09-25 | Discharge: 2019-09-25 | Disposition: A | Discharge status: Home / Self Care | Payer: BC Managed Care – PPO | Attending: Family Medicine | Admitting: Family Medicine

## 2019-09-25 DIAGNOSIS — Z8616 Personal history of COVID-19: Secondary | ICD-10-CM | POA: Diagnosis not present

## 2019-09-25 DIAGNOSIS — Z20822 Contact with and (suspected) exposure to covid-19: Secondary | ICD-10-CM | POA: Insufficient documentation

## 2019-09-25 DIAGNOSIS — J0100 Acute maxillary sinusitis, unspecified: Secondary | ICD-10-CM | POA: Diagnosis not present

## 2019-09-25 DIAGNOSIS — R519 Headache, unspecified: Secondary | ICD-10-CM | POA: Diagnosis present

## 2019-09-25 MED ORDER — FLUTICASONE PROPIONATE 50 MCG/ACT NA SUSP: 2.0000 | Freq: Every day | NASAL | 2 refills | Status: DC

## 2019-09-25 MED ORDER — AMOXICILLIN 875 MG PO TABS: 875.0000 mg | ORAL_TABLET | Freq: Two times a day (BID) | ORAL | 0 refills | Status: DC

## 2019-09-25 NOTE — ED Provider Notes (Signed)
[facility]    CSN: [id number] Arrival date & time: 09/25/19  0848      History   Chief Complaint Chief Complaint  Patient presents with  . Facial Pain    HPI Michelle Herman is a 39 y.o. female.   HPI   Patient states that she has allergies every year.  This year she is having some cough cold runny nose, sinus pressure and pain for about a week.  She states the last couple days has been feeling worse.  She has a headache since yesterday.  No sore throat.  No known exposure to Covid.  She states she had a Covid infection in January, therefore does not think that is what this is.  Has not had Covid vaccinations.  She is taking allergy medication, Xyzal daily.  Denies fever chills, or body aches  History reviewed. No pertinent past medical history.  There are no problems to display for this patient.   Past Surgical History:  Procedure Laterality Date  . ECTOPIC PREGNANCY SURGERY      OB History    Gravida  5   Para  1   Term  1   Preterm      AB  3   Living  1     SAB      TAB      Ectopic  1   Multiple      Live Births  1            Home Medications    Prior to Admission medications   Medication Sig Start Date End Date Taking? Authorizing Provider  ELDERBERRY PO Take 2 tablets by mouth daily.   Yes [provider]  Multiple Vitamins-Minerals (WOMENS DAILY FORMULA PO) Take 1 tablet by mouth daily.   Yes [provider]  naproxen sodium (ALEVE) 220 MG tablet Take 220 mg by mouth 2 (two) times daily as needed (pain).   Yes [provider]  Probiotic Product (PROBIOTIC DAILY PO) Take 1 tablet by mouth daily.   Yes [provider]  amoxicillin (AMOXIL) 875 MG tablet Take 1 tablet (875 mg total) by mouth 2 (two) times daily. 09/25/19   Raylene Everts, MD  fluticasone [facility]) 50 MCG/ACT nasal spray Place 2 sprays into both nostrils daily. 09/25/19   Raylene Everts, MD  dicyclomine (BENTYL) 20 MG  tablet Take 1 tablet (20 mg total) by mouth 3 (three) times daily as needed for spasms. Patient not taking: Reported on 06/22/2019 06/18/16 09/25/19  Leftwich-Kirby, Kathie Dike, CNM  promethazine (PHENERGAN) 25 MG tablet Take 1 tablet (25 mg total) by mouth every 6 (six) hours as needed for nausea or vomiting. Patient not taking: Reported on 06/22/2019 06/18/16 09/25/19  Elvera Maria, CNM    Family History Family History  Problem Relation Age of Onset  . Hypertension Mother   . Asthma Mother   . Thyroid disease Mother     Social History Social History   Tobacco Use  . Smoking status: Never Smoker  . Smokeless tobacco: Never Used  Substance Use Topics  . Alcohol use: Yes  . Drug use: No     Allergies   Patient has no known allergies.   Review of Systems Review of Systems  Constitutional: Negative for chills and fever.  HENT: Positive for congestion, postnasal drip, rhinorrhea, sinus pressure and sinus pain. Negative for sore throat.   Eyes: Positive for photophobia.  Respiratory: Negative for cough.   Neurological:  Positive for headaches.     Physical Exam Triage Vital Signs ED Triage Vitals  Enc Vitals Group     BP 09/25/19 0916 114/69     Pulse Rate 09/25/19 0916 91     Resp 09/25/19 0916 18     Temp 09/25/19 0916 99 F (37.2 C)     Temp Source 09/25/19 0916 Oral     SpO2 09/25/19 0916 100 %     Weight --      Height --      Head Circumference --      Peak Flow --      Pain Score 09/25/19 0914 8     Pain Loc --      Pain Edu? --      Excl. in [place]? --    No data found.  Updated Vital Signs BP 114/69 (BP Location: Left Arm)   Pulse 91   Temp 99 F (37.2 C) (Oral)   Resp 18   LMP 09/22/2019   SpO2 100%      Physical Exam Constitutional:      General: She is not in acute distress.    Appearance: She is well-developed.     Comments: Appears uncomfortable.  Sunglasses in place  HENT:     Head: Normocephalic and atraumatic.     Right Ear:  Tympanic membrane, ear canal and external ear normal.     Left Ear: Tympanic membrane, ear canal and external ear normal.     Nose: Congestion present.     Mouth/Throat:     Pharynx: Posterior oropharyngeal erythema present.     Comments: Mask is in place.  Steer pharynx mildly injected.  Nasal congestion is noted.  Tenderness over maxillary sinuses is marked Eyes:     Conjunctiva/sclera: Conjunctivae normal.     Pupils: Pupils are equal, round, and reactive to light.  Cardiovascular:     Rate and Rhythm: Normal rate and regular rhythm.     Heart sounds: Normal heart sounds.  Pulmonary:     Effort: Pulmonary effort is normal. No respiratory distress.     Breath sounds: Normal breath sounds.  Musculoskeletal:        General: Normal range of motion.     Cervical back: Normal range of motion.  Skin:    General: Skin is warm and dry.  Neurological:     Mental Status: She is alert.  Psychiatric:        Mood and Affect: Mood normal.        Behavior: Behavior normal.      UC Treatments / Results  Labs (all labs ordered are listed, but only abnormal results are displayed) Labs Reviewed  SARS CORONAVIRUS 2 (TAT 6-24 HRS)    EKG   Radiology No results found.  Procedures Procedures (including critical care time)  Medications Ordered in UC Medications - No data to display  Initial Impression / Assessment and Plan / UC Course  I have reviewed the triage vital signs and the nursing notes.  Pertinent labs & imaging results that were available during my care of the patient were reviewed by me and considered in my medical decision making (see chart for details).     Likely acute sinusitis as a complication of her allergies.  Covid testing done.  Importance of quarantine pending results as discussed.  Covid vaccination recommended and discussed. Final Clinical Impressions(s) / UC Diagnoses   Final diagnoses:  Acute non-recurrent maxillary sinusitis     Discharge  Instructions     Drink plenty of fluids Rest Take your Aleve for the headache Take Amoxil 2 times a day for sinus infection Start using the Flonase to reduce the swelling and drainage Expect improvement over next couple of days   ED Prescriptions    Medication Sig Dispense Auth. Provider   amoxicillin (AMOXIL) 875 MG tablet Take 1 tablet (875 mg total) by mouth 2 (two) times daily. 14 tablet Raylene Everts, MD   fluticasone [facility]) 50 MCG/ACT nasal spray Place 2 sprays into both nostrils daily. 16 g Raylene Everts, MD     PDMP not reviewed this encounter.   Raylene Everts, MD 09/25/19 1002

## 2019-09-25 NOTE — Discharge Instructions (Signed)
Drink plenty of fluids Rest Take your Aleve for the headache Take Amoxil 2 times a day for sinus infection Start using the Flonase to reduce the swelling and drainage Expect improvement over next couple of days

## 2019-09-25 NOTE — ED Triage Notes (Signed)
Pt c/o sinus pressure/pain behind eyes, nose for approx 1 week. Pt also reports some nasal congestion. Pt states she has h/o eye strain/HA r/t computer use and HA has worsened the past week. Denies fever, chills, abdom pain, n/v/d, sore throat or runny nose. No OTC pain relievers used.

## 2019-09-26 LAB — SARS CORONAVIRUS 2 (TAT 6-24 HRS): SARS Coronavirus 2: NEGATIVE

## 2020-04-28 ENCOUNTER — Other Ambulatory Visit: Payer: No Typology Code available for payment source

## 2020-04-28 DIAGNOSIS — Z20822 Contact with and (suspected) exposure to covid-19: Secondary | ICD-10-CM

## 2020-04-30 LAB — NOVEL CORONAVIRUS, NAA: SARS-CoV-2, NAA: NOT DETECTED

## 2020-04-30 LAB — SARS-COV-2, NAA 2 DAY TAT

## 2020-05-19 ENCOUNTER — Other Ambulatory Visit: Payer: No Typology Code available for payment source

## 2020-05-19 DIAGNOSIS — Z20822 Contact with and (suspected) exposure to covid-19: Secondary | ICD-10-CM

## 2020-05-20 LAB — SARS-COV-2, NAA 2 DAY TAT

## 2020-05-20 LAB — NOVEL CORONAVIRUS, NAA: SARS-CoV-2, NAA: NOT DETECTED

## 2020-05-27 ENCOUNTER — Other Ambulatory Visit: Payer: No Typology Code available for payment source

## 2020-05-28 ENCOUNTER — Other Ambulatory Visit: Payer: No Typology Code available for payment source

## 2020-05-30 ENCOUNTER — Other Ambulatory Visit: Payer: No Typology Code available for payment source

## 2020-05-30 DIAGNOSIS — Z20822 Contact with and (suspected) exposure to covid-19: Secondary | ICD-10-CM

## 2020-06-01 LAB — NOVEL CORONAVIRUS, NAA

## 2020-06-17 ENCOUNTER — Other Ambulatory Visit: Payer: Self-pay

## 2020-06-20 ENCOUNTER — Other Ambulatory Visit: Payer: Self-pay

## 2020-06-20 DIAGNOSIS — Z20822 Contact with and (suspected) exposure to covid-19: Secondary | ICD-10-CM

## 2020-06-21 LAB — NOVEL CORONAVIRUS, NAA: SARS-CoV-2, NAA: NOT DETECTED

## 2020-06-21 LAB — SARS-COV-2, NAA 2 DAY TAT

## 2020-09-15 ENCOUNTER — Ambulatory Visit: Payer: Federal, State, Local not specified - PPO | Attending: Critical Care Medicine

## 2020-09-15 DIAGNOSIS — Z20822 Contact with and (suspected) exposure to covid-19: Secondary | ICD-10-CM

## 2020-09-16 LAB — NOVEL CORONAVIRUS, NAA: SARS-CoV-2, NAA: NOT DETECTED

## 2020-09-16 LAB — SARS-COV-2, NAA 2 DAY TAT

## 2022-07-04 ENCOUNTER — Encounter: Payer: Self-pay | Admitting: Family Medicine

## 2022-07-04 ENCOUNTER — Other Ambulatory Visit: Payer: Self-pay | Admitting: Family Medicine

## 2022-07-04 DIAGNOSIS — R102 Pelvic and perineal pain: Secondary | ICD-10-CM

## 2022-08-14 ENCOUNTER — Other Ambulatory Visit: Payer: Federal, State, Local not specified - PPO

## 2022-08-27 ENCOUNTER — Other Ambulatory Visit: Payer: Federal, State, Local not specified - PPO

## 2022-09-14 ENCOUNTER — Ambulatory Visit
Admission: RE | Admit: 2022-09-14 | Discharge: 2022-09-14 | Disposition: A | Discharge status: Home / Self Care | Payer: Federal, State, Local not specified - PPO | Source: Ambulatory Visit | Attending: Family Medicine | Admitting: Family Medicine

## 2022-09-14 DIAGNOSIS — R102 Pelvic and perineal pain: Secondary | ICD-10-CM

## 2022-09-21 ENCOUNTER — Encounter: Payer: Self-pay | Admitting: Cardiology

## 2022-09-21 ENCOUNTER — Ambulatory Visit: Payer: Federal, State, Local not specified - PPO | Admitting: Cardiology

## 2022-09-21 VITALS — BP 116/57 | HR 69 | Resp 17 | Ht 63.0 in | Wt 148.0 lb

## 2022-09-21 DIAGNOSIS — R072 Precordial pain: Secondary | ICD-10-CM | POA: Insufficient documentation

## 2022-09-21 DIAGNOSIS — R0609 Other forms of dyspnea: Secondary | ICD-10-CM | POA: Insufficient documentation

## 2022-09-21 NOTE — Progress Notes (Signed)
Patient referred by Leilani Able, MD for chest pain  Subjective:   Michelle Herman, female    DOB: 05-Mar-1981, 42 y.o.   MRN: [id number]   Chief Complaint  Patient presents with   Palpitations   New Patient (Initial Visit)     HPI  42 y.o. African-American female with chest pain, exertional dyspnea  Also referral given for chief complaint of palpitations, patient denies of palpitations to me.  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  Her job is usually sedentary, but she is just started back walking regularly.  She has noticed exertional dyspnea with walking, as well as with climbing stairs.  She also reports pain across her chest, described as tightness, that seems to occur at rest, not with exertion.  These episodes last for 5 to 10 minutes, and resolve on their own.  There is no significant correlation with physical activity, meals, deep breathing.  Patient is non-smoker, does not have any premature family history of CAD.   History reviewed. No pertinent past medical history.   Past Surgical History:  Procedure Laterality Date   ECTOPIC PREGNANCY SURGERY       Social History   Tobacco Use  Smoking Status Never  Smokeless Tobacco Never    Social History   Substance and Sexual Activity  Alcohol Use Yes   Comment: occ     Family History  Problem Relation Age of Onset   Hypertension Mother    Asthma Mother    Thyroid disease Mother       Current Outpatient Medications:    ELDERBERRY PO, Take 2 tablets by mouth daily., Disp: , Rfl:    Multiple Vitamins-Minerals (WOMENS DAILY FORMULA PO), Take 1 tablet by mouth daily., Disp: , Rfl:    Probiotic Product (PROBIOTIC DAILY PO), Take 1 tablet by mouth daily., Disp: , Rfl:    Cardiovascular and other pertinent studies:  Reviewed external labs and tests, independently interpreted  EKG 09/21/2022: Sinus rhythm 79 bpm  Normal EKG     Recent labs: Not available    Review of Systems   Cardiovascular:  Positive for chest pain and dyspnea on exertion. Negative for leg swelling, palpitations and syncope.         Vitals:   09/21/22 0845  BP: (!) 116/57  Pulse: 69  Resp: 17  SpO2: 98%     Body mass index is 26.22 kg/m. Filed Weights   09/21/22 0845  Weight: 148 lb (67.1 kg)     Objective:   Physical Exam Vitals and nursing note reviewed.  Constitutional:      General: She is not in acute distress. Neck:     Vascular: No JVD.  Cardiovascular:     Rate and Rhythm: Normal rate and regular rhythm.     Heart sounds: Normal heart sounds. No murmur heard. Pulmonary:     Effort: Pulmonary effort is normal.     Breath sounds: Normal breath sounds. No wheezing or rales.            Visit diagnoses:   ICD-10-CM   1. Palpitations  R00.2 EKG 12-Lead    2. Precordial pain  R07.2 PCV CARDIAC STRESS TEST    PCV ECHOCARDIOGRAM COMPLETE       Orders Placed This Encounter  Procedures   PCV CARDIAC STRESS TEST   EKG 12-Lead   PCV ECHOCARDIOGRAM COMPLETE       Assessment & Recommendations:    42 y.o. African-American female with chest  pain, exertional dyspnea  Physical exam and EKG unremarkable.  Symptoms are most likely noncardiac in etiology, could be related to deconditioning.  I will obtain exercise treadmill stress test and echocardiogram.  If both tests are normal, consider noncardiac etiology for chest pain.  Further recommendations after above testing.    Thank you for referring the patient to [place]. Please feel free to contact with any questions.   Elder Negus, MD Pager: [phone] Office: [phone]

## 2022-10-05 ENCOUNTER — Ambulatory Visit: Payer: Federal, State, Local not specified - PPO

## 2022-10-05 DIAGNOSIS — R072 Precordial pain: Secondary | ICD-10-CM

## 2022-10-18 ENCOUNTER — Other Ambulatory Visit: Payer: Federal, State, Local not specified - PPO

## 2022-10-18 ENCOUNTER — Ambulatory Visit: Payer: Federal, State, Local not specified - PPO

## 2022-10-18 DIAGNOSIS — R072 Precordial pain: Secondary | ICD-10-CM

## 2023-03-20 ENCOUNTER — Other Ambulatory Visit: Payer: Self-pay | Admitting: Family Medicine

## 2023-03-20 DIAGNOSIS — Z1231 Encounter for screening mammogram for malignant neoplasm of breast: Secondary | ICD-10-CM

## 2023-04-19 ENCOUNTER — Ambulatory Visit
Admission: RE | Admit: 2023-04-19 | Discharge: 2023-04-19 | Disposition: A | Discharge status: Home / Self Care | Payer: Federal, State, Local not specified - PPO | Source: Ambulatory Visit | Attending: Family Medicine | Admitting: Family Medicine

## 2023-04-19 DIAGNOSIS — Z1231 Encounter for screening mammogram for malignant neoplasm of breast: Secondary | ICD-10-CM

## 2023-05-30 ENCOUNTER — Other Ambulatory Visit: Payer: Self-pay | Admitting: Obstetrics and Gynecology

## 2023-05-30 DIAGNOSIS — N6320 Unspecified lump in the left breast, unspecified quadrant: Secondary | ICD-10-CM

## 2023-06-26 ENCOUNTER — Ambulatory Visit
Admission: RE | Admit: 2023-06-26 | Discharge: 2023-06-26 | Disposition: A | Discharge status: Home / Self Care | Payer: Federal, State, Local not specified - PPO | Source: Ambulatory Visit | Attending: Obstetrics and Gynecology

## 2023-06-26 DIAGNOSIS — N6320 Unspecified lump in the left breast, unspecified quadrant: Secondary | ICD-10-CM

## 2024-03-06 ENCOUNTER — Emergency Department (HOSPITAL_COMMUNITY)

## 2024-03-06 ENCOUNTER — Emergency Department (HOSPITAL_COMMUNITY)
Admission: EM | Admit: 2024-03-06 | Discharge: 2024-03-06 | Disposition: A | Discharge status: Home / Self Care | Attending: Emergency Medicine | Admitting: Emergency Medicine

## 2024-03-06 DIAGNOSIS — R1032 Left lower quadrant pain: Secondary | ICD-10-CM | POA: Diagnosis present

## 2024-03-06 LAB — COMPREHENSIVE METABOLIC PANEL WITH GFR
ALT: 6 U/L (ref 0–44)
AST: 14 U/L — ABNORMAL LOW (ref 15–41)
Albumin: 4.1 g/dL (ref 3.5–5.0)
Alkaline Phosphatase: 63 U/L (ref 38–126)
Anion gap: 8 (ref 5–15)
BUN: 11 mg/dL (ref 6–20)
CO2: 24 mmol/L (ref 22–32)
Calcium: 9.1 mg/dL (ref 8.9–10.3)
Chloride: 105 mmol/L (ref 98–111)
Creatinine, Ser: 0.89 mg/dL (ref 0.44–1.00)
GFR, Estimated: >60 mL/min (ref >60)
Glucose, Bld: 92 mg/dL (ref 70–99)
Potassium: 3.7 mmol/L (ref 3.5–5.1)
Sodium: 137 mmol/L (ref 135–145)
Total Bilirubin: 0.5 mg/dL (ref 0.0–1.2)
Total Protein: 7.1 g/dL (ref 6.5–8.1)

## 2024-03-06 LAB — CBC
HCT: 36.9 % (ref 36.0–46.0)
Hemoglobin: 11.8 g/dL — ABNORMAL LOW (ref 12.0–15.0)
MCH: 26.1 pg (ref 26.0–34.0)
MCHC: 32 g/dL (ref 30.0–36.0)
MCV: 81.6 fL (ref 80.0–100.0)
Platelets: 206 K/uL (ref 150–400)
RBC: 4.52 MIL/uL (ref 3.87–5.11)
RDW: 12.9 % (ref 11.5–15.5)
WBC: 5.9 K/uL (ref 4.0–10.5)
nRBC: 0 % (ref 0.0–0.2)

## 2024-03-06 LAB — URINALYSIS, ROUTINE W REFLEX MICROSCOPIC
Bilirubin Urine: NEGATIVE
Glucose, UA: NEGATIVE mg/dL
Hgb urine dipstick: NEGATIVE
Ketones, ur: NEGATIVE mg/dL
Leukocytes,Ua: NEGATIVE
Nitrite: NEGATIVE
Protein, ur: NEGATIVE mg/dL
Specific Gravity, Urine: 1.017 (ref 1.005–1.030)
pH: 5 (ref 5.0–8.0)

## 2024-03-06 LAB — HCG, SERUM, QUALITATIVE: Preg, Serum: NEGATIVE

## 2024-03-06 LAB — LIPASE, BLOOD: Lipase: 47 U/L (ref 11–51)

## 2024-03-06 MED ORDER — OXYCODONE-ACETAMINOPHEN 5-325 MG PO TABS
1.0000 | ORAL_TABLET | Freq: Once | ORAL | Status: AC
  Administered 2024-03-06: 1 via ORAL
  Filled 2024-03-06: qty 1

## 2024-03-06 MED ORDER — IOHEXOL 300 MG/ML  SOLN
100.0000 mL | Freq: Once | INTRAMUSCULAR | Status: AC | PRN
  Administered 2024-03-06: 100 mL via INTRAVENOUS

## 2024-03-06 MED ORDER — DICYCLOMINE HCL 20 MG PO TABS: 20.0000 mg | ORAL_TABLET | Freq: Two times a day (BID) | ORAL | 0 refills | Status: AC

## 2024-03-06 NOTE — ED Provider Notes (Signed)
 [facility] DEPART[facility] AT [facility] Provider Note   CSN: [id number] Arrival date & time: 03/06/24  9064     Patient presents with: Abdominal Pain   Michelle Herman is a 43 y.o. female.   Patient is a 42 year old female with a past medical history of ovarian cyst and prior ectopic pregnancy presenting to the emergency department with left lower quadrant pain.  The patient states that she has had mild pain coming and going for the last 2 days however woke up from her sleep this morning with the severe pain and it has been constant since then.  She states that she has been nauseous but has not vomited.  Denies any fever, diarrhea or constipation, dysuria or hematuria or abnormal vaginal discharge.  She states that this does feel similar to her prior ectopic pregnancy but does not believe that she is pregnant at this time.  The history is provided by the patient.  Abdominal Pain      Prior to Admission medications   Medication Sig Start Date End Date Taking? Authorizing Provider  dicyclomine  (BENTYL ) 20 MG tablet Take 1 tablet (20 mg total) by mouth 2 (two) times daily. 03/06/24  Yes Kingsley, Niamya Vittitow K, DO  ELDERBERRY PO Take 2 tablets by mouth daily.    [provider]  Multiple Vitamins-Minerals (WOMENS DAILY FORMULA PO) Take 1 tablet by mouth daily.    [provider]  Probiotic Product (PROBIOTIC DAILY PO) Take 1 tablet by mouth daily.    [provider]  promethazine  (PHENERGAN ) 25 MG tablet Take 1 tablet (25 mg total) by mouth every 6 (six) hours as needed for nausea or vomiting. Patient not taking: Reported on 06/22/2019 06/18/16 09/25/19  Milly Olam LABOR, CNM    Allergies: Patient has no known allergies.    Review of Systems  Gastrointestinal:  Positive for abdominal pain.    Updated Vital Signs BP 109/68 (BP Location: Left Arm)   Pulse (!) 54   Temp 98 F (36.7 C) (Oral)   Resp 18   LMP 02/26/2024 (Exact Date)    SpO2 95%   Physical Exam Vitals and nursing note reviewed.  Constitutional:      General: She is not in acute distress.    Appearance: She is well-developed.  HENT:     Head: Normocephalic and atraumatic.     Mouth/Throat:     Mouth: Mucous membranes are moist.  Eyes:     Extraocular Movements: Extraocular movements intact.  Cardiovascular:     Rate and Rhythm: Normal rate and regular rhythm.     Heart sounds: Normal heart sounds.  Pulmonary:     Effort: Pulmonary effort is normal.     Breath sounds: Normal breath sounds.  Abdominal:     General: Abdomen is flat.     Palpations: Abdomen is soft.     Tenderness: There is abdominal tenderness in the left lower quadrant. There is guarding. There is no right CVA tenderness, left CVA tenderness or rebound.  Skin:    General: Skin is warm and dry.  Neurological:     General: No focal deficit present.     Mental Status: She is alert and oriented to person, place, and time.  Psychiatric:        Mood and Affect: Mood normal.        Behavior: Behavior normal.     (all labs ordered are listed, but only abnormal results are displayed) Labs Reviewed  COMPREHENSIVE METABOLIC PANEL  WITH GFR - Abnormal; Notable for the following components:      Result Value   AST 14 (*)    All other components within normal limits  CBC - Abnormal; Notable for the following components:   Hemoglobin 11.8 (*)    All other components within normal limits  LIPASE, BLOOD  URINALYSIS, ROUTINE W REFLEX MICROSCOPIC  HCG, SERUM, QUALITATIVE    EKG: None  Radiology: CT ABDOMEN PELVIS W CONTRAST Result Date: 03/06/2024 EXAM: CT ABDOMEN AND PELVIS WITH CONTRAST 03/06/2024 03:08:46 PM TECHNIQUE: CT of the abdomen and pelvis was performed with the administration of 100 mL of iohexol (OMNIPAQUE) 300 MG/ML solution. Multiplanar reformatted images are provided for review. Automated exposure control, iterative reconstruction, and/or weight-based adjustment of  the mA/kV was utilized to reduce the radiation dose to as low as reasonably achievable. COMPARISON: 08/16/2016 CLINICAL HISTORY: LLQ abdominal pain. FINDINGS: LOWER CHEST: No acute abnormality. LIVER: Stable left hepatic hemangiomas. GALLBLADDER AND BILE DUCTS: Gallbladder is unremarkable. No biliary ductal dilatation. SPLEEN: No acute abnormality. PANCREAS: No acute abnormality. ADRENAL GLANDS: No acute abnormality. KIDNEYS, URETERS AND BLADDER: No stones in the kidneys or ureters. No hydronephrosis. No perinephric or periureteral stranding. Urinary bladder is unremarkable. GI AND BOWEL: Stomach demonstrates no acute abnormality. There is no bowel obstruction. PERITONEUM AND RETROPERITONEUM: No ascites. No free air. VASCULATURE: Aorta is normal in caliber. LYMPH NODES: No lymphadenopathy. REPRODUCTIVE ORGANS: No acute abnormality. BONES AND SOFT TISSUES: No acute osseous abnormality. No focal soft tissue abnormality. IMPRESSION: 1. No acute findings in the abdomen or pelvis. Electronically signed by: Lynwood Seip MD 03/06/2024 03:42 PM EST RP Workstation: [id number]   US  Pelvis Complete Result Date: 03/06/2024 EXAM: US  Pelvis, Complete Transvaginal and Transabdominal with Doppler 03/06/2024 12:27:31 PM TECHNIQUE: Transabdominal and transvaginal pelvic duplex ultrasound using B-mode/gray scaled imaging with Doppler spectral analysis and color flow was obtained. COMPARISON: 09/14/2022 CLINICAL HISTORY: L pelvic pain, history of ovarian cysts. FINDINGS: UTERUS: Uterus measures 8.0 x 4.6 x 4.7 cm. Uterus demonstrates normal myometrial echotexture. ENDOMETRIAL STRIPE: Endometrial stripe measures 4 mm. RIGHT OVARY: Right ovary measures 3.7 x 2.2 x 2.9 cm. Physiologic follicles noted. There is normal arterial and venous Doppler flow. LEFT OVARY: Left ovary measures 3.6 x 2.8 x 3.3 cm. Physiologic follicles noted. Isoechoic lesion in the left ovary measuring 1.3 x 1.7 x 1.3 cm, possibly a collapsed corpus luteum cyst.  There is normal arterial and venous Doppler flow. FREE FLUID: Small volume of free fluid in the pelvis and right adnexa. IMPRESSION: 1. Nonenlarged ovaries. No sonographic findings to suggest ovarian torsion at this time. Otherwise, normal pelvic ultrasound. . Electronically signed by: Rogelia Myers MD 03/06/2024 01:59 PM EST RP Workstation: [id number]   US  Art/Ven Flow Abd Pelv Doppler Result Date: 03/06/2024 EXAM: US  Pelvis, Complete Transvaginal and Transabdominal with Doppler 03/06/2024 12:27:31 PM TECHNIQUE: Transabdominal and transvaginal pelvic duplex ultrasound using B-mode/gray scaled imaging with Doppler spectral analysis and color flow was obtained. COMPARISON: 09/14/2022 CLINICAL HISTORY: L pelvic pain, history of ovarian cysts. FINDINGS: UTERUS: Uterus measures 8.0 x 4.6 x 4.7 cm. Uterus demonstrates normal myometrial echotexture. ENDOMETRIAL STRIPE: Endometrial stripe measures 4 mm. RIGHT OVARY: Right ovary measures 3.7 x 2.2 x 2.9 cm. Physiologic follicles noted. There is normal arterial and venous Doppler flow. LEFT OVARY: Left ovary measures 3.6 x 2.8 x 3.3 cm. Physiologic follicles noted. Isoechoic lesion in the left ovary measuring 1.3 x 1.7 x 1.3 cm, possibly a collapsed corpus luteum cyst. There is normal arterial and venous Doppler  flow. FREE FLUID: Small volume of free fluid in the pelvis and right adnexa. IMPRESSION: 1. Nonenlarged ovaries. No sonographic findings to suggest ovarian torsion at this time. Otherwise, normal pelvic ultrasound. . Electronically signed by: Rogelia Myers MD 03/06/2024 01:59 PM EST RP Workstation: [id number]     Procedures   Medications Ordered in the ED  oxyCODONE-acetaminophen  (PERCOCET/ROXICET) 5-325 MG per tablet 1 tablet (1 tablet Oral Given 03/06/24 1227)  iohexol (OMNIPAQUE) 300 MG/ML solution 100 mL (100 mLs Intravenous Contrast Given 03/06/24 1457)    Clinical Course as of 03/06/24 1555  Fri Mar 06, 2024  1418 Pelvic ultrasound negative.  Will recommend CTAP to evaluate for alternative etiology of pain. [VK]  1547 No acute abnormality on CTAP. Unclear etiology of pain but with improvement of symptoms, patient is stable for discharge home.  [VK]    Clinical Course User Index [VK] Kingsley, Tyrea Froberg K, DO                                 Medical Decision Making This patient presents to the ED with chief complaint(s) of abdominal pain with pertinent past medical history of ovarian cyst and prior ectopic pregnancy which further complicates the presenting complaint. The complaint involves an extensive differential diagnosis and also carries with it a high risk of complications and morbidity.    The differential diagnosis includes ovarian cyst rupture, torsion, ectopic, pregnancy, diverticulitis, UTI, pyelonephritis less likely as no CVA tenderness, nephrolithiasis, gastroenteritis, colitis  Additional history obtained: Additional history obtained from N/A Records reviewed Care Everywhere/External Records  ED Course and Reassessment: On patient's arrival she is hemodynamically stable in no acute distress.  Was initially evaluated in triage and had labs and urine performed that are within normal range.  Will pelvic ultrasound performed to evaluate for possible torsion and will be given pain control.  If no obvious etiology for pain may need CT scan as well.  She will be closely reassessed.  Independent labs interpretation:  The following labs were independently interpreted: within normal range  Independent visualization of imaging: - I independently visualized the following imaging with scope of interpretation limited to determining acute life threatening conditions related to emergency care: TVUS, CTAP, which revealed no acute abnormality to explain symptoms  Consultation: - Consulted or discussed management/test interpretation w/ external professional: N/A  Consideration for admission or further workup: Patient has no emergent  conditions requiring admission or further work-up at this time and is stable for discharge home with primary care follow-up  Social Determinants of health: N/A    Amount and/or Complexity of Data Reviewed Labs: ordered. Radiology: ordered.  Risk Prescription drug management.       Final diagnoses:  LLQ abdominal pain    ED Discharge Orders          Ordered    dicyclomine  (BENTYL ) 20 MG tablet  2 times daily        03/06/24 1554               Kingsley, Delona Clasby K, DO 03/06/24 1555

## 2024-03-06 NOTE — Discharge Instructions (Signed)
 You were seen in the emergency department for your abdominal pain.  Your workup showed no signs of infection, ovarian cysts or twisting of your ovaries or kidney stone.  It is unclear what is causing your symptoms and it could have been to spasm in your intestines.  You can take Tylenol  up to every 6 hours as needed for pain and Bentyl  as needed for additional pain.  You can follow-up with your primary doctor to have your symptoms rechecked.  You should return to the emergency department if your pain gets significantly worse, you have fevers, repetitive vomiting or any other new or concerning symptoms.

## 2024-03-06 NOTE — ED Triage Notes (Signed)
 LLQ pain starting Tuesday which went away, then returned in the middle of the night which brought patient to tears. Usually has painful periods, but her period stopped on Sunday. Pain is now shooting down her left leg and causing nausea. Hx of having a HSG test done.
